# Patient Record
Sex: Female | Born: 1970 | Race: White | Hispanic: No | Marital: Married | Smoking: Never smoker
Health system: Southern US, Community
[De-identification: ages and names within clinical notes are randomized; demographics above are authoritative.]

## PROBLEM LIST (undated history)

## (undated) DIAGNOSIS — Z973 Presence of spectacles and contact lenses: Secondary | ICD-10-CM

## (undated) DIAGNOSIS — M953 Acquired deformity of neck: Secondary | ICD-10-CM

## (undated) DIAGNOSIS — I10 Essential (primary) hypertension: Secondary | ICD-10-CM

## (undated) DIAGNOSIS — E119 Type 2 diabetes mellitus without complications: Secondary | ICD-10-CM

## (undated) DIAGNOSIS — Z9289 Personal history of other medical treatment: Secondary | ICD-10-CM

## (undated) DIAGNOSIS — E669 Obesity, unspecified: Secondary | ICD-10-CM

## (undated) DIAGNOSIS — E785 Hyperlipidemia, unspecified: Secondary | ICD-10-CM

## (undated) DIAGNOSIS — Z8742 Personal history of other diseases of the female genital tract: Secondary | ICD-10-CM

## (undated) DIAGNOSIS — Z8249 Family history of ischemic heart disease and other diseases of the circulatory system: Secondary | ICD-10-CM

## (undated) HISTORY — DX: Hyperlipidemia, unspecified: E78.5

## (undated) HISTORY — DX: Personal history of other medical treatment: Z92.89

## (undated) HISTORY — DX: Presence of spectacles and contact lenses: Z97.3

## (undated) HISTORY — DX: Personal history of other diseases of the female genital tract: Z87.42

## (undated) HISTORY — DX: Obesity, unspecified: E66.9

## (undated) HISTORY — PX: BREAST LUMPECTOMY: SHX2

## (undated) HISTORY — DX: Acquired deformity of neck: M95.3

## (undated) HISTORY — DX: Family history of ischemic heart disease and other diseases of the circulatory system: Z82.49

## (undated) HISTORY — PX: TUBAL LIGATION: SHX77

## (undated) HISTORY — DX: Essential (primary) hypertension: I10

## (undated) HISTORY — DX: Type 2 diabetes mellitus without complications: E11.9

---
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
## 1998-01-31 DIAGNOSIS — I10 Essential (primary) hypertension: Secondary | ICD-10-CM

## 1998-01-31 HISTORY — DX: Essential (primary) hypertension: I10

## 2000-09-24 ENCOUNTER — Emergency Department (HOSPITAL_COMMUNITY): Admission: EM | Admit: 2000-09-24 | Discharge: 2000-09-24 | Payer: Self-pay | Admitting: Emergency Medicine

## 2000-09-24 ENCOUNTER — Encounter: Payer: Self-pay | Admitting: Emergency Medicine

## 2000-11-20 ENCOUNTER — Other Ambulatory Visit: Admission: RE | Admit: 2000-11-20 | Discharge: 2000-11-20 | Payer: Self-pay | Admitting: *Deleted

## 2001-03-21 ENCOUNTER — Ambulatory Visit (HOSPITAL_BASED_OUTPATIENT_CLINIC_OR_DEPARTMENT_OTHER): Admission: RE | Admit: 2001-03-21 | Discharge: 2001-03-21 | Payer: Self-pay | Admitting: *Deleted

## 2001-04-04 ENCOUNTER — Ambulatory Visit (HOSPITAL_BASED_OUTPATIENT_CLINIC_OR_DEPARTMENT_OTHER): Admission: RE | Admit: 2001-04-04 | Discharge: 2001-04-04 | Payer: Self-pay | Admitting: *Deleted

## 2001-11-08 ENCOUNTER — Other Ambulatory Visit: Admission: RE | Admit: 2001-11-08 | Discharge: 2001-11-08 | Payer: Self-pay | Admitting: *Deleted

## 2002-01-31 DIAGNOSIS — Z8742 Personal history of other diseases of the female genital tract: Secondary | ICD-10-CM

## 2002-01-31 DIAGNOSIS — E119 Type 2 diabetes mellitus without complications: Secondary | ICD-10-CM

## 2002-01-31 HISTORY — DX: Personal history of other diseases of the female genital tract: Z87.42

## 2002-01-31 HISTORY — DX: Type 2 diabetes mellitus without complications: E11.9

## 2008-06-06 ENCOUNTER — Encounter (INDEPENDENT_AMBULATORY_CARE_PROVIDER_SITE_OTHER): Payer: Self-pay | Admitting: *Deleted

## 2008-06-06 DIAGNOSIS — I10 Essential (primary) hypertension: Secondary | ICD-10-CM | POA: Insufficient documentation

## 2008-06-26 ENCOUNTER — Ambulatory Visit: Payer: Self-pay | Admitting: Family Medicine

## 2008-06-26 DIAGNOSIS — N943 Premenstrual tension syndrome: Secondary | ICD-10-CM | POA: Insufficient documentation

## 2008-06-26 LAB — CONVERTED CEMR LAB
Alkaline Phosphatase: 103 units/L (ref 39–117)
BUN: 10 mg/dL (ref 6–23)
CO2: 24 meq/L (ref 19–32)
Cholesterol: 172 mg/dL (ref 0–200)
Creatinine, Ser: 0.73 mg/dL (ref 0.40–1.20)
Glucose, Bld: 167 mg/dL — ABNORMAL HIGH (ref 70–99)
HCT: 40.5 % (ref 36.0–46.0)
HDL: 43 mg/dL (ref >39)
LDL Cholesterol: 97 mg/dL (ref 0–99)
MCHC: 32.3 g/dL (ref 30.0–36.0)
MCV: 82.7 fL (ref 78.0–100.0)
RBC: 4.9 M/uL (ref 3.87–5.11)
Sodium: 139 meq/L (ref 135–145)
Total Bilirubin: 0.5 mg/dL (ref 0.3–1.2)
Total CHOL/HDL Ratio: 4
Total Protein: 7.6 g/dL (ref 6.0–8.3)
Triglycerides: 160 mg/dL — ABNORMAL HIGH (ref <150)
VLDL: 32 mg/dL (ref 0–40)
WBC: 8.7 10*3/uL (ref 4.0–10.5)

## 2008-06-27 ENCOUNTER — Encounter: Payer: Self-pay | Admitting: Family Medicine

## 2009-01-08 ENCOUNTER — Ambulatory Visit: Payer: Self-pay | Admitting: Family Medicine

## 2009-01-08 DIAGNOSIS — E119 Type 2 diabetes mellitus without complications: Secondary | ICD-10-CM | POA: Insufficient documentation

## 2009-02-05 ENCOUNTER — Ambulatory Visit: Payer: Self-pay | Admitting: Family Medicine

## 2009-02-05 DIAGNOSIS — E669 Obesity, unspecified: Secondary | ICD-10-CM | POA: Insufficient documentation

## 2009-03-25 ENCOUNTER — Ambulatory Visit: Payer: Self-pay | Admitting: Family Medicine

## 2009-05-07 ENCOUNTER — Ambulatory Visit: Payer: Self-pay | Admitting: Family Medicine

## 2009-05-07 LAB — CONVERTED CEMR LAB
Alkaline Phosphatase: 76 units/L (ref 39–117)
BUN: 13 mg/dL (ref 6–23)
CO2: 23 meq/L (ref 19–32)
Cholesterol: 180 mg/dL (ref 0–200)
Creatinine, Ser: 0.6 mg/dL (ref 0.40–1.20)
Glucose, Bld: 112 mg/dL — ABNORMAL HIGH (ref 70–99)
HDL: 50 mg/dL (ref >39)
LDL Cholesterol: 107 mg/dL — ABNORMAL HIGH (ref 0–99)
Sodium: 137 meq/L (ref 135–145)
Total Bilirubin: 0.5 mg/dL (ref 0.3–1.2)
Total Protein: 7.6 g/dL (ref 6.0–8.3)
Triglycerides: 117 mg/dL (ref <150)
VLDL: 23 mg/dL (ref 0–40)

## 2009-05-08 ENCOUNTER — Encounter: Payer: Self-pay | Admitting: Family Medicine

## 2010-03-02 NOTE — Consult Note (Signed)
Summary: [facility]   Imported By: Clydell Hakim 05/14/2009 10:36:06  _____________________________________________________________________  External Attachment:    Type:   Image     Comment:   External Document  Appended Document: [facility]    Clinical Lists Changes  Observations: Added new observation of DIAB EYE EX: Dr Elmer Picker (05/01/2009 11:31)       Allergies: No Known Drug Allergies   -  Date:  05/01/2009    Diabetes Eye Exam Dr Elmer Picker

## 2010-03-02 NOTE — Assessment & Plan Note (Addendum)
Summary: f/u,df   Vital Signs:  Patient profile:   40 year old female Weight:      231.1 pounds Temp:     98.9 degrees F oral Pulse rate:   93 / minute Pulse rhythm:   regular BP sitting:   123 / 82  (left arm) Cuff size:   large  Vitals Entered By: Loralee Pacas CMA (May 07, 2009 9:51 AM)  History of Present Illness: HYPERTENSION Disease Monitoring Blood pressure range:not checking   Chest pain:N   Dyspnea:N Medications Compliance:daily   Lightheadedness:N   Edema:N   DIABETES Disease Monitoring Blood Sugar ranges:all below 200   Polyuria:N   Visual problems:N Medications Compliance:daily   Hypoglycemic symptoms:N   Prementral Syndrome stable with prilosec daily.  No bleeding or throat problems (symptoms after fish bone resolved)  PREVENTION Diet:starting   Exercise:starting  ROS - as above PMH - Medications reviewed and updated in medication list.  Smoking Status noted in VS form      Current Medications (verified): 1)  Centrum  Tabs (Multiple Vitamins-Minerals) .... Take 1 Tablet By Mouth Once Daily 2)  Calcium 600/vitamin D 600-400 Mg-Unit Tabs (Calcium Carbonate-Vitamin D) .... Take 2 Tablets By Mouth Once Daily 3)  Aspirin 81 Mg Tabs (Aspirin) .... Take 1 Tablet By Mouth Once Daily 4)  Super B Complex  Tabs (B Complex-C) .... Take 1 Tablet By Mouth Once Daily 5)  Zinc 50 Mg Tabs (Zinc) .... Take 1 Tablet By Mouth Two Times A Day 6)  Ester-C  Tabs (Bioflavonoid Products) .... Take 1 Tablet By Mouth Two Times A Day 7)  Prozac 20 Mg Caps (Fluoxetine Hcl) .Marland Kitchen.. 1 By Mouth Daily 8)  Lisinopril 20 Mg Tabs (Lisinopril) .Marland Kitchen.. 1 Tablet By Mouth Daily 9)  Metformin Hcl 1000 Mg Tabs (Metformin Hcl) .Marland Kitchen.. 1 By Mouth Two Times A Day 10)  Prilosec 10 Mg Cpdr (Omeprazole) .Marland Kitchen.. 1 At Bedtime May Increase To 2 At Bedtime Ifnot Working  Otc  Allergies: No Known Drug Allergies  Physical Exam  General:  Well-developed,well-nourished,in no acute distress; alert,appropriate  and cooperative throughout examination obese Lungs:  Normal respiratory effort, chest expands symmetrically. Lungs are clear to auscultation, no crackles or wheezes. Heart:  Normal rate and regular rhythm. S1 and S2 normal without gallop, murmur, click, rub or other extra sounds.  Diabetes Management Exam:    Foot Exam (with socks and/or shoes not present):       Sensory-Pinprick/Light touch:          Left medial foot (L-4): normal          Left dorsal foot (L-5): normal          Left lateral foot (S-1): normal          Right medial foot (L-4): normal          Right dorsal foot (L-5): normal          Right lateral foot (S-1): normal       Sensory-Monofilament:          Left foot: normal          Right foot: normal       Inspection:          Left foot: normal          Right foot: normal       Nails:          Left foot: normal          Right foot: normal  Impression & Recommendations:  Problem # 1:  HYPERTENSION (ICD-401.9)  Good control  Her updated medication list for this problem includes:    Lisinopril 20 Mg Tabs (Lisinopril) .Marland Kitchen... 1 tablet by mouth daily  Orders: Comp Met-[facility] [id number]) [facility]- Est  Level 4 ([address]214)  BP today: 123/82 Prior BP: 121/81 (03/25/2009)  Labs Reviewed: K+: 4.3 (06/26/2008) Creat: : 0.73 (06/26/2008)   Chol: 172 (06/26/2008)   HDL: 43 (06/26/2008)   LDL: 97 (06/26/2008)   TG: 160 (06/26/2008)  Problem # 2:  DIABETES MELLITUS (ICD-250.00)  good control.  She will be seeing Dr Elmer Picker for eye exam  Her updated medication list for this problem includes:    Aspirin 81 Mg Tabs (Aspirin) .Marland Kitchen... Take 1 tablet by mouth once daily    Lisinopril 20 Mg Tabs (Lisinopril) .Marland Kitchen... 1 tablet by mouth daily    Metformin Hcl 1000 Mg Tabs (Metformin hcl) .Marland Kitchen... 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   Will monitor  Her updated medication list for this problem includes:    Aspirin 81 Mg Tabs (Aspirin) .Marland Kitchen... Take 1 tablet by mouth once daily  Orders: [facility]- Est  Level 4 (57846)  Complete Medication List: 1)  Centrum Tabs (Multiple vitamins-minerals) .... Take 1 tablet by mouth once daily 2)  Calcium 600/vitamin D 600-400 Mg-unit Tabs (Calcium carbonate-vitamin d) .... Take 2 tablets by mouth once daily 3)  Aspirin 81 Mg Tabs (Aspirin) .... Take 1 tablet by mouth once daily 4)  Super B Complex Tabs (B complex-c) .... Take 1 tablet by mouth once daily 5)  Zinc 50 Mg Tabs (Zinc) .... Take 1 tablet by mouth two times a day 6)  Ester-c Tabs (Bioflavonoid products) .... Take 1 tablet by mouth two times a day 7)  Prozac 20 Mg Caps (Fluoxetine hcl) .Marland Kitchen.. 1 by mouth daily 8)  Lisinopril 20 Mg Tabs (Lisinopril) .Marland Kitchen.. 1 tablet by mouth daily 9)  Metformin Hcl 1000 Mg Tabs (Metformin hcl) .Marland Kitchen.. 1 by mouth two times a day 10)  Prilosec 10 Mg Cpdr (Omeprazole) .Marland Kitchen.. 1 at bedtime may increase to 2 at bedtime ifnot working  otc  Other Orders: Tdap => 58yrs IM (96295) Admin 1st Vaccine (28413)  Patient Instructions: 1)  Come back for Pap Test 2)  I will call you if your lab is abnormal otherwise I will send you a letter within 2 weeks. 3)  Ask Dr Elmer Picker to send me a report on your eye exam 4)  Work on exercise and diet Prescriptions: LISINOPRIL 20 MG TABS (LISINOPRIL) 1 tablet by mouth daily  #90 x 3   Entered and Authorized by:   Pearlean Brownie MD   Signed by:   Pearlean Brownie MD on 05/07/2009   Method used:   Electronically to        [facility] (retail)       [address]       [place], [place]  [address]       Ph: [phone] or [phone]       Fax:  [phone]   RxID:  [id number]   Laboratory Results   Blood Tests   Date/Time Received: May 07, 2009 9:50 AM  Date/Time Reported: May 07, 2009 10:11 AM   HGBA1C: 6.9%   (Normal Range: Non-Diabetic - 3-6%   Control Diabetic - 6-8%)  Comments: ...........test performed by...........Marland KitchenTerese Door, CMA       Prevention & Chronic Care Immunizations   Influenza vaccine: Not documented    Tetanus booster: 05/07/2009: Tdap    Pneumococcal vaccine: Not documented  Other Screening   Pap smear: Not documented   Smoking status: never  (03/25/2009)  Diabetes Mellitus   HgbA1C: 6.9  (05/07/2009)    Eye exam: Not documented    Foot exam: yes  (05/07/2009)   High risk foot: Not documented   Foot care education: Not documented    Urine microalbumin/creatinine ratio: Not documented    Diabetes flowsheet reviewed?: Yes   Progress toward A1C goal: At goal  Lipids   Total Cholesterol: 172  (06/26/2008)   LDL: 97  (06/26/2008)   LDL Direct: Not documented   HDL: 43  (06/26/2008)   Triglycerides: 160  (06/26/2008)  Hypertension   Last Blood Pressure: 123 / 82  (05/07/2009)   Serum creatinine: 0.73  (06/26/2008)   Serum potassium 4.3  (06/26/2008) CMP ordered     Hypertension flowsheet reviewed?: Yes   Progress toward BP goal: At goal  Self-Management Support :    Diabetes self-management support: Not documented    Hypertension self-management support: Not documented   Immunizations Administered:  Tetanus Vaccine:    Vaccine Type: Tdap    Site: left deltoid    Mfr: [employer]    Dose: 0.5 ml    Route: IM    Given by: Jone Baseman CMA    Exp. Date: 04/24/2011    Lot #: [id number]    VIS given: 12/19/06 version given May 07, 2009.

## 2010-03-02 NOTE — Assessment & Plan Note (Signed)
Summary: fish bone stuck in throat,tcb   Vital Signs:  Patient profile:   40 year old female Height:      64 inches Weight:      230 pounds BMI:     39.62 Temp:     98.4 degrees F oral Pulse rate:   109 / minute BP sitting:   121 / 81  (left arm) Cuff size:   large  Vitals Entered By: Tessie Fass CMA (March 25, 2009 11:48 AM) CC: bone stuck in throat? Is Patient Diabetic? Yes Pain Assessment Patient in pain? yes     Location: head Intensity: 4   CC:  bone stuck in throat?Marland Kitchen  History of Present Illness: Patient presents with acute complaint of pain with degluttition.  Was at [facility] for lunch on Sunday, Feb 20th, believes she swallowed a bone and had pain in her throat with swallowing.  No cough, no choking, did not feel like she aspirated.  Did not vomit.  Since then has persisted with pain only upon initiating swallowing. Has been able to eat despite the pain.  Has not developed cough or rhinorrhea, no diarrhea or vomiting.  Does not have hoarseness as she did when diagnosed wtih GERD.  Still taking prilosec with good relief of GERD.  Of note, her mother in law died this morning.  Has been hospitalized for awhile, family had been visiting her with gowns and masks in hospital.  Has had a lot of stress related to this, and to the care of her 40 yr old special-needs son and her 15 yr old daughter.  Declines anxiolytic medications for short-term stress related to this situation.   Habits & Providers  Alcohol-Tobacco-Diet     Tobacco Status: never  Current Medications (verified): 1)  Centrum  Tabs (Multiple Vitamins-Minerals) .... Take 1 Tablet By Mouth Once Daily 2)  Calcium 600/vitamin D 600-400 Mg-Unit Tabs (Calcium Carbonate-Vitamin D) .... Take 2 Tablets By Mouth Once Daily 3)  Aspirin 81 Mg Tabs (Aspirin) .... Take 1 Tablet By Mouth Once Daily 4)  Super B Complex  Tabs (B Complex-C) .... Take 1 Tablet By Mouth Once Daily 5)  Zinc 50 Mg Tabs (Zinc) ....  Take 1 Tablet By Mouth Two Times A Day 6)  Ester-C  Tabs (Bioflavonoid Products) .... Take 1 Tablet By Mouth Two Times A Day 7)  Prozac 20 Mg Caps (Fluoxetine Hcl) .Marland Kitchen.. 1 By Mouth Daily 8)  Lisinopril 20 Mg Tabs (Lisinopril) .Marland Kitchen.. 1 Tablet By Mouth Daily 9)  Metformin Hcl 1000 Mg Tabs (Metformin Hcl) .Marland Kitchen.. 1 By Mouth Two Times A Day 10)  Prilosec 10 Mg Cpdr (Omeprazole) .Marland Kitchen.. 1 At Bedtime May Increase To 2 At Bedtime Ifnot Working  Otc 11)  Lidocaine Viscous 2 % Soln (Lidocaine Hcl) .... Sig Gargle With Viscous Lidocaine Every Hour As Needed Disp 4 Oz or Other Standard Size  Allergies (verified): No Known Drug Allergies  Physical Exam  General:  well appearing, no apparent distress. talking normally.  Eyes:  Clear sclerae and conjunctivae Ears:  External ear exam shows no significant lesions or deformities.  Otoscopic examination reveals clear canals, tympanic membranes are intact bilaterally without bulging, retraction, inflammation or discharge. Hearing is grossly normal bilaterally. Nose:  External nasal examination shows no deformity or inflammation. Nasal mucosa are pink and moist without lesions or exudates. Mouth:  Oral mucosa and oropharynx without lesions or exudates.  Teeth in good repair. Mallampati 3 (difficult to visualize entire oropharynx and uvula) Neck:  No deformities, masses, or tenderness noted. Lungs:  Normal respiratory effort, chest expands symmetrically. Lungs are clear to auscultation, no crackles or wheezes.   Impression & Recommendations:  Problem # 1:  ACUTE PHARYNGITIS (ICD-462) Patient with dysphagia since swallowing a fish bone at lunch on Feb 20th; has persisted wtih pain only with swallowing, otherwise able to eat/drink normally.  Never affected her airway.  Will try viscous lidocaine gargles as needed for presumed irritation/minor trauma of oropharynx.  If not better, consider endoscopic evaluation by ENT or GI for question or foreign body retention (although  seems exceedingly unlikely). Mother in law died this morning, I suspect that her other complaints of achiness, etc are related to poor sleep, stress associated with family situation.  No symptoms of ILI, or Gastroenteritis. Her updated medication list for this problem includes:    Aspirin 81 Mg Tabs (Aspirin) .Marland Kitchen... Take 1 tablet by mouth once daily    Lidocaine Viscous 2 % Soln (Lidocaine hcl) ..... Sig gargle with viscous lidocaine every hour as needed disp 4 oz or other standard size  Complete Medication List: 1)  Centrum Tabs (Multiple vitamins-minerals) .... Take 1 tablet by mouth once daily 2)  Calcium 600/vitamin D 600-400 Mg-unit Tabs (Calcium carbonate-vitamin d) .... Take 2 tablets by mouth once daily 3)  Aspirin 81 Mg Tabs (Aspirin) .... Take 1 tablet by mouth once daily 4)  Super B Complex Tabs (B complex-c) .... Take 1 tablet by mouth once daily 5)  Zinc 50 Mg Tabs (Zinc) .... Take 1 tablet by mouth two times a day 6)  Ester-c Tabs (Bioflavonoid products) .... Take 1 tablet by mouth two times a day 7)  Prozac 20 Mg Caps (Fluoxetine hcl) .Marland Kitchen.. 1 by mouth daily 8)  Lisinopril 20 Mg Tabs (Lisinopril) .Marland Kitchen.. 1 tablet by mouth daily 9)  Metformin Hcl 1000 Mg Tabs (Metformin hcl) .Marland Kitchen.. 1 by mouth two times a day 10)  Prilosec 10 Mg Cpdr (Omeprazole) .Marland Kitchen.. 1 at bedtime may increase to 2 at bedtime ifnot working  otc 11)  Lidocaine Viscous 2 % Soln (Lidocaine hcl) .... Sig gargle with viscous lidocaine every hour as needed disp 4 oz or other standard size Prescriptions: LIDOCAINE VISCOUS 2 % SOLN (LIDOCAINE HCL) SIG Gargle with viscous lidocaine every hour as needed DISP 4 oz or other standard size  #1 x 3   Entered and Authorized by:   Paula Compton MD   Signed by:   Paula Compton MD on 03/25/2009   Method used:   Electronically to        [facility] (retail)       [address]       [place], [place]  [address]       Ph: [phone] or [phone]        Fax: [phone]   RxID:   [id number]   Appended Document: fish bone stuck in throat,tcb    Clinical Lists Changes  Orders: Added new Test order of [facility]- Est Level  3 ([address]) - Signed

## 2010-03-02 NOTE — Assessment & Plan Note (Signed)
Summary: FU/KH   Vital Signs:  Patient profile:   40 year old female Height:      64 inches Weight:      230.2 pounds BMI:     39.66 Temp:     98.7 degrees F oral Pulse rate:   97 / minute BP sitting:   131 / 83  (right arm) Cuff size:   large  Vitals Entered By: Garen Grams LPN (February 05, 2009 11:04 AM) CC: f/u dm and BP Is Patient Diabetic? Yes Did you bring your meter with you today? Yes Pain Assessment Patient in pain? no        CC:  f/u dm and BP.  History of Present Illness: COUGH Onset: had several weeks ago during last visit presumed mild URI Description: tickle in throat worse at PM sometimes will wake up with cough Modifying factors: None   Symptoms Productive: N Wheezing: N Dyspnea: N Nasal discharge: mild but not post nasal drip  Fever: N Sore throat: not sore but does have occaisional episode when wil throw up and be hoarse for a few days Sick contacts: N Heartburn symptoms: uses zantac as needed but not regularly  History of Asthma: N  Red Flags  Weight loss: N Hemoptysis: N Edema: N  HYPERTENSION Disease Monitoring   Blood pressure range: not taking      Chest pain: N     Dyspnea:N Medications   Compliance: daily   Lightheadedness: N     Edema:N Prevention   Exercise: N    Salt restriction:N  ROS - as above PMH - Medications reviewed and updated in medication list.  Smoking Status noted in VS form        Habits & Providers  Alcohol-Tobacco-Diet     Tobacco Status: never  Current Medications (verified): 1)  Centrum  Tabs (Multiple Vitamins-Minerals) .... Take 1 Tablet By Mouth Once Daily 2)  Calcium 600/vitamin D 600-400 Mg-Unit Tabs (Calcium Carbonate-Vitamin D) .... Take 2 Tablets By Mouth Once Daily 3)  Aspirin 81 Mg Tabs (Aspirin) .... Take 1 Tablet By Mouth Once Daily 4)  Super B Complex  Tabs (B Complex-C) .... Take 1 Tablet By Mouth Once Daily 5)  Zinc 50 Mg Tabs (Zinc) .... Take 1 Tablet By Mouth Two Times A Day 6)   Ester-C  Tabs (Bioflavonoid Products) .... Take 1 Tablet By Mouth Two Times A Day 7)  Prozac 20 Mg Caps (Fluoxetine Hcl) .Marland Kitchen.. 1 By Mouth Daily 8)  Lisinopril 20 Mg Tabs (Lisinopril) .Marland Kitchen.. 1 Tablet By Mouth Daily 9)  Metformin Hcl 1000 Mg Tabs (Metformin Hcl) .Marland Kitchen.. 1 By Mouth Two Times A Day 10)  Prilosec 10 Mg Cpdr (Omeprazole) .Marland Kitchen.. 1 At Bedtime May Increase To 2 At Bedtime Ifnot Working  Otc  Allergies: No Known Drug Allergies  Physical Exam  General:  Well-developed,well-nourished,in no acute distress; alert,appropriate and cooperative throughout examination Nose:  External nasal examination shows no deformity or inflammation. Nasal mucosa are pink and moist without lesions or exudates. Mouth:  Oral mucosa and oropharynx without lesions or exudates.  Teeth in good repair. Neck:  No deformities, masses, or tenderness noted. Lungs:  Normal respiratory effort, chest expands symmetrically. Lungs are clear to auscultation, no crackles or wheezes. Heart:  Normal rate and regular rhythm. S1 and S2 normal without gallop, murmur, click, rub or other extra sounds.   Impression & Recommendations:  Problem # 1:  COUGH (ICD-786.2)  most consistent with GE reflux.  No signs of infection  or bronchospasm.  Start regular over the counter ppi for cost reasons and follow  Orders: [facility]- Est Level  3 ([address])  Problem # 2:  HYPERTENSION (ICD-401.9) controlled today  Her updated medication list for this problem includes:    Lisinopril 20 Mg Tabs (Lisinopril) .Marland Kitchen... 1 tablet by mouth daily  BP today: 131/83 Prior BP: 147/85 (01/08/2009)  Labs Reviewed: K+: 4.3 (06/26/2008) Creat: : 0.73 (06/26/2008)   Chol: 172 (06/26/2008)   HDL: 43 (06/26/2008)   LDL: 97 (06/26/2008)   TG: 160 (06/26/2008)  Problem # 3:  DIABETES MELLITUS (ICD-250.00) some of her fasting readings today were > 200 but her A1c s are good will recheck A1c in 3 months  Her updated medication list for this problem includes:     Aspirin 81 Mg Tabs (Aspirin) .Marland Kitchen... Take 1 tablet by mouth once daily    Lisinopril 20 Mg Tabs (Lisinopril) .Marland Kitchen... 1 tablet by mouth daily    Metformin Hcl 1000 Mg Tabs (Metformin hcl) .Marland Kitchen... 1 by mouth two times a day  Problem # 4:  OBESITY (ICD-278.00) gained weight over the holidays.  She realizes she needs to work on this  Complete Medication List: 1)  Centrum Tabs (Multiple vitamins-minerals) .... Take 1 tablet by mouth once daily 2)  Calcium 600/vitamin D 600-400 Mg-unit Tabs (Calcium carbonate-vitamin d) .... Take 2 tablets by mouth once daily 3)  Aspirin 81 Mg Tabs (Aspirin) .... Take 1 tablet by mouth once daily 4)  Super B Complex Tabs (B complex-c) .... Take 1 tablet by mouth once daily 5)  Zinc 50 Mg Tabs (Zinc) .... Take 1 tablet by mouth two times a day 6)  Ester-c Tabs (Bioflavonoid products) .... Take 1 tablet by mouth two times a day 7)  Prozac 20 Mg Caps (Fluoxetine hcl) .Marland Kitchen.. 1 by mouth daily 8)  Lisinopril 20 Mg Tabs (Lisinopril) .Marland Kitchen.. 1 tablet by mouth daily 9)  Metformin Hcl 1000 Mg Tabs (Metformin hcl) .Marland Kitchen.. 1 by mouth two times a day 10)  Prilosec 10 Mg Cpdr (Omeprazole) .Marland Kitchen.. 1 at bedtime may increase to 2 at bedtime ifnot working  otc  Patient Instructions: 1)  Please schedule a follow-up appointment in 3 months .  2)  Call if blood sugar are regularly > 200 or if blood pressure is regularly > 140/90 3)  Use omeprazole for cough - take daily if no better in 3 -4 weeks call 4)  You need to have a Pap Smear to prevent cervical cancer.   Prevention & Chronic Care Immunizations   Influenza vaccine: Not documented    Tetanus booster: Not documented    Pneumococcal vaccine: Not documented  Other Screening   Pap smear: Not documented   Smoking status: never  (02/05/2009)  Diabetes Mellitus   HgbA1C: 7.3  (01/08/2009)    Eye exam: Not documented    Foot exam: Not documented   High risk foot: Not documented   Foot care education: Not documented    Urine  microalbumin/creatinine ratio: Not documented  Lipids   Total Cholesterol: 172  (06/26/2008)   LDL: 97  (06/26/2008)   LDL Direct: Not documented   HDL: 43  (06/26/2008)   Triglycerides: 160  (06/26/2008)  Hypertension   Last Blood Pressure: 131 / 83  (02/05/2009)   Serum creatinine: 0.73  (06/26/2008)   Serum potassium 4.3  (06/26/2008)  Self-Management Support :    Diabetes self-management support: Not documented    Hypertension self-management support: Not documented

## 2010-03-02 NOTE — Letter (Signed)
Summary: Generic Letter  [facility]  [address]   [place], [place] [address]   Phone: [phone]  Fax: [phone]    05/08/2009  Elaine Brown [address] [place], [place]  [address]  Dear Ms. Guardado,  Your blood tests were all good except for your cholesterol.  The LDL (bad) cholesterol went up to 107. It should be below 100.   We can start you on a medicine or you can work on diet and exercise and see if you can bring it down.   We should recheck a fasting cholesterol in 6 months if you want to try diet.  Call and let me know if you would like to start a medicine  Sincerely,   Pearlean Brownie MD  Appended Document: Generic Letter mailed.

## 2010-05-21 ENCOUNTER — Encounter: Payer: Self-pay | Admitting: Family Medicine

## 2010-05-21 ENCOUNTER — Ambulatory Visit (INDEPENDENT_AMBULATORY_CARE_PROVIDER_SITE_OTHER): Payer: Self-pay | Admitting: Family Medicine

## 2010-05-21 ENCOUNTER — Ambulatory Visit: Payer: Self-pay | Admitting: Family Medicine

## 2010-05-21 VITALS — BP 137/94 | HR 112 | Temp 99.7°F | Ht 67.0 in | Wt 231.0 lb

## 2010-05-21 DIAGNOSIS — N762 Acute vulvitis: Secondary | ICD-10-CM

## 2010-05-21 DIAGNOSIS — N760 Acute vaginitis: Secondary | ICD-10-CM

## 2010-05-21 DIAGNOSIS — E119 Type 2 diabetes mellitus without complications: Secondary | ICD-10-CM

## 2010-05-21 LAB — POCT WET PREP (WET MOUNT): Trichomonas Wet Prep HPF POC: NEGATIVE

## 2010-05-21 MED ORDER — CIPROFLOXACIN HCL 500 MG PO TABS: 500.0000 mg | ORAL_TABLET | Freq: Two times a day (BID) | ORAL | Status: AC

## 2010-05-21 MED ORDER — SULFAMETHOXAZOLE-TRIMETHOPRIM 800-160 MG PO TABS: ORAL_TABLET | ORAL | Status: DC

## 2010-05-21 MED ORDER — CIPROFLOXACIN HCL 750 MG PO TABS: 750.0000 mg | ORAL_TABLET | Freq: Two times a day (BID) | ORAL | Status: DC

## 2010-05-21 MED ORDER — FLUCONAZOLE 150 MG PO TABS: ORAL_TABLET | ORAL | Status: DC

## 2010-05-21 NOTE — Patient Instructions (Signed)
You must come in to see Dr. Deirdre Priest ASAP next week, clinic on Monday to follow up on this infection  Cipro and Bactrim for bacterial infection Fluconazole for yeast

## 2010-05-21 NOTE — Progress Notes (Signed)
  Subjective:    Patient ID: Elaine Brown, female    DOB: 09-17-1970, 40 y.o.   MRN: [id number]  HPI Patient reports a painful swollen vaginal area for several weeks, increasing in intensity for the past few days.  She endorses not taking her metformin for quite sometime and not coming in for regular visits as she knows that she should.  Financially she is strapped.  She spends her time caring for a special needs son.  She denies chills and fever.  She did have one episode this week of lightheadedness but it resolved.  She denies nausea and vomiting.    Review of Systems  Constitutional: Negative for fever and chills.  Gastrointestinal: Negative for nausea and vomiting.  Genitourinary: Positive for vaginal pain. Negative for dysuria, difficulty urinating and pelvic pain.  Neurological: Positive for light-headedness.       Objective:   Physical Exam  Constitutional:       Red faced, alert and cooperative  Cardiovascular: Regular rhythm.        tachycardia  Pulmonary/Chest: Effort normal and breath sounds normal.  Genitourinary:       Red, indurated labia, right > left, thick white discharge with thin vaginal walls.  Right labia likely 10 times its normal size.    Musculoskeletal: She exhibits edema.  Skin: Skin is dry.  Psychiatric: Her behavior is normal.          Assessment & Plan:

## 2010-05-21 NOTE — Assessment & Plan Note (Signed)
Has not taken metformin for months

## 2010-05-21 NOTE — Assessment & Plan Note (Signed)
Very concerning for severe infection and potential for systemic complications.  She is tachy and febrile (low grade).  Consulted with preceptor and will treat with Bactrim DS, 2 tabs bid for 10 days, Cipro 500 mg bid for 7 days, fluconazole 100 mg on 3 occasions, today, on day 3 and when antibiotics are completed.  She is to see her primary MD on Monday for follow up.  Reinforced that poor glycemic control will give rise to these infections and that she needs to care for herself.  Did not do extra labs as she is very concerned about bills and it would not have changed the plan.

## 2010-05-26 ENCOUNTER — Ambulatory Visit (INDEPENDENT_AMBULATORY_CARE_PROVIDER_SITE_OTHER): Payer: Self-pay | Admitting: Family Medicine

## 2010-05-26 ENCOUNTER — Encounter: Payer: Self-pay | Admitting: Family Medicine

## 2010-05-26 VITALS — BP 131/82 | HR 120 | Temp 98.6°F | Ht 64.0 in | Wt 223.5 lb

## 2010-05-26 DIAGNOSIS — N762 Acute vulvitis: Secondary | ICD-10-CM

## 2010-05-26 DIAGNOSIS — N760 Acute vaginitis: Secondary | ICD-10-CM

## 2010-05-26 DIAGNOSIS — E119 Type 2 diabetes mellitus without complications: Secondary | ICD-10-CM

## 2010-05-26 DIAGNOSIS — I10 Essential (primary) hypertension: Secondary | ICD-10-CM

## 2010-05-26 LAB — COMPREHENSIVE METABOLIC PANEL
ALT: 64 U/L — ABNORMAL HIGH (ref 0–35)
Albumin: 4.5 g/dL (ref 3.5–5.2)
CO2: 20 mEq/L (ref 19–32)
Calcium: 9.4 mg/dL (ref 8.4–10.5)
Chloride: 96 mEq/L (ref 96–112)
Glucose, Bld: 262 mg/dL — ABNORMAL HIGH (ref 70–99)
Potassium: 5.1 mEq/L (ref 3.5–5.3)
Sodium: 129 mEq/L — ABNORMAL LOW (ref 135–145)
Total Protein: 8.2 g/dL (ref 6.0–8.3)

## 2010-05-26 LAB — POCT GLYCOSYLATED HEMOGLOBIN (HGB A1C): Hemoglobin A1C: 8.8

## 2010-05-26 MED ORDER — METFORMIN HCL 1000 MG PO TABS: 1000.0000 mg | ORAL_TABLET | Freq: Two times a day (BID) | ORAL | Status: DC

## 2010-05-26 NOTE — Assessment & Plan Note (Signed)
Well controlled.  No signs of complications or red flags.  Continue current regimen.  Check labs

## 2010-05-26 NOTE — Progress Notes (Signed)
  Subjective:    Patient ID: Elaine Brown, female    DOB: [birth year], 40 y.o.   MRN: [id number]  HPI  Vaginal infection - improving.  Swelling decreased.  Taking antibiotic without problems.  No fever  DIABETES Disease Monitoring: Blood Sugar ranges-not checking Polyuria/phagia/dipsia- no      Visual problems- no Medications: Compliance- out of metformin for months Hypoglycemic symptoms- no  HYPERTENSION Disease Monitoring Blood pressure range-not checking Chest pain- no      Dyspnea- no Medications Compliance- taking lisinopril daily Lightheadedness- no   Edema- no  Review of Symptoms - see HPI  PMH - Smoking status noted.  No problems with taking metformin in the past     Review of Systems     Objective:   Physical Exam    Heart - Regular rate and rhythm.  No murmurs, gallops or rubs.    Lungs:  Normal respiratory effort, chest expands symmetrically. Lungs are clear to auscultation, no crackles or wheezes. Vagina - left labia approximately 2x size of Right. No fluctuance minimally tender diffusely red     Assessment & Plan:

## 2010-05-26 NOTE — Assessment & Plan Note (Signed)
Improving.  Continue medications.  Discussed poor diabetes mellitus control likely contributor

## 2010-05-26 NOTE — Patient Instructions (Signed)
Come back in 3 month for a Pap smear and Diabetes check  Call if the infection is not steadily improving  I will call you if your tests are not good.  Otherwise I will send you a letter.  If you do not hear from me with in 2 weeks please call our office.     Take metformin twice a day every day

## 2010-05-26 NOTE — Assessment & Plan Note (Signed)
Poorly controlled due to lack of medications.  Restart and recheck in 3  Months

## 2010-05-27 ENCOUNTER — Encounter: Payer: Self-pay | Admitting: Family Medicine

## 2010-06-18 NOTE — Op Note (Signed)
[place]. [facility]  Patient:    Elaine Brown, Elaine Brown Visit Number: [id number] MRN: [id number]          Service Type: DSU Location: [facility] Attending Physician:  Kendell Bane Dictated by:   Lowell Bouton, M.D. Proc. Date: 03/21/01 Admit Date:  03/21/2001 Discharge Date: 03/21/2001   CC:         Bernadene Person, M.D.   Operative Report  PREOPERATIVE DIAGNOSIS:  Right carpal tunnel syndrome.  POSTOPERATIVE DIAGNOSIS:  Right carpal tunnel syndrome.  PROCEDURE:  Decompression median nerve right carpal tunnel.  SURGEON:  Lowell Bouton, M.D.  ANESTHESIA:  0.5% Marcaine local with sedation.  OPERATIVE FINDINGS:  The patient had significant pressure on the median nerve. The carpal canal was very tight and there were no masses identified.  PROCEDURE:  Under 0.5% Marcaine local anesthesia with a tourniquet on the right arm, the right hand was prepped and draped in usual fashion.  After exsanguinating the limb, the tourniquet was inflated to 250 mmHg. A longitudinal incision was made in the palm that measured about 3 cm in length just ulnar to the thenar crease.  Sharp dissection was carried through the subcutaneous tissues and bleeding points were coagulated.  Blunt dissection was carried distal to the transverse carpal ligament through the superficial palmar fascia.  A hemostat was placed in the carpal canal up against the hook of the Hamate and the transverse carpal ligament was divided on the ulnar border of the median nerve.  The proximal end of the ligament was divided with the scissors after dissecting the nerve away from the undersurface of the ligament.  The carpal canal was then palpated and was found to be adequately decompressed.  The nerve was examined and an external epineurolysis was performed.  The motor branch was identified and found to be intact.  The wound was then irrigated and skin was closed with a 4-0  nylon suture.  Sterile dressings were applied, followed by a volar wrist splint.  The tourniquet was released with good circulation to the hand.  The patient went to the recovery room awake and stable in good condition. Dictated by:   Lowell Bouton, M.D. Attending Physician:  Kendell Bane DD:  03/21/01 TD:  03/22/01 Job: [id number]

## 2010-06-18 NOTE — Op Note (Signed)
[place]. [facility]  Patient:    Elaine Brown, Elaine Brown Visit Number: [id number] MRN: [id number]          Service Type: DSU Location: [facility] Attending Physician:  Kendell Bane Dictated by:   Lowell Bouton, M.D. Proc. Date: 04/04/01 Admit Date:  04/04/2001   CC:         Bernadene Person, M.D.   Operative Report  PREOPERATIVE DIAGNOSIS:  Left carpal tunnel syndrome.  POSTOPERATIVE DIAGNOSIS:  Left carpal tunnel syndrome.  OPERATION PERFORMED:  Decompression median nerve, left carpal tunnel.  SURGEON:  Lowell Bouton, M.D.  ANESTHESIA:  0.5% Marcaine local with sedation.  OPERATIVE FINDINGS:  The patient had a very narrow carpal canal with no masses present.  The nerve had some significant compression.  DESCRIPTION OF PROCEDURE:  Under 0.5% Marcaine local anesthesia with a tourniquet on the left arm, the left hand was prepped and draped in the usual fashion and after exsanguinating the limb, the tourniquet was inflated to 250 mmHg.  A 3 cm longitudinal incision was made in the palm just ulnar to the thenar crease.  Sharp dissection was carried through the subcutaneous tissues down to superficial palmar fascia.  Blunt dissection was carried through the superficial palmar fascia down to the transverse carpal ligament.  A hemostat was placed in the carpal canal up against the hook of the hamate and the transverse carpal ligament was divided on the ulnar border of the median nerve.  The proximal end of the ligament was divided with the scissors after dissecting the nerve away from the undersurface of the ligament.  The carpal canal was then palpated and was found to be adequately decompressed.  The nerve was released from the undersurface of the ligament.  The wound was irrigated with saline and the skin was closed with 4-0 nylon sutures. Sterile dressings were applied followed by a volar wrist splint.  The patient tolerated the  procedure well and went to the recovery room awake and stable in good condition. Dictated by:   Lowell Bouton, M.D. Attending Physician:  Kendell Bane DD:  04/04/01 TD:  04/04/01 Job: [id number]

## 2010-07-26 ENCOUNTER — Encounter: Payer: Self-pay | Admitting: Family Medicine

## 2010-07-26 ENCOUNTER — Ambulatory Visit (INDEPENDENT_AMBULATORY_CARE_PROVIDER_SITE_OTHER): Payer: Self-pay | Admitting: Family Medicine

## 2010-07-26 VITALS — BP 146/87 | HR 118 | Temp 98.7°F | Ht 64.25 in | Wt 226.0 lb

## 2010-07-26 DIAGNOSIS — Z124 Encounter for screening for malignant neoplasm of cervix: Secondary | ICD-10-CM

## 2010-07-26 DIAGNOSIS — E119 Type 2 diabetes mellitus without complications: Secondary | ICD-10-CM

## 2010-07-26 DIAGNOSIS — I10 Essential (primary) hypertension: Secondary | ICD-10-CM

## 2010-07-26 LAB — COMPREHENSIVE METABOLIC PANEL
AST: 20 U/L (ref 0–37)
Albumin: 4.5 g/dL (ref 3.5–5.2)
Alkaline Phosphatase: 74 U/L (ref 39–117)
Potassium: 4.2 mEq/L (ref 3.5–5.3)
Sodium: 138 mEq/L (ref 135–145)
Total Bilirubin: 0.5 mg/dL (ref 0.3–1.2)
Total Protein: 7.6 g/dL (ref 6.0–8.3)

## 2010-07-26 LAB — LIPID PANEL
LDL Cholesterol: 106 mg/dL — ABNORMAL HIGH (ref 0–99)
VLDL: 26 mg/dL (ref 0–40)

## 2010-07-26 MED ORDER — LISINOPRIL 20 MG PO TABS: 20.0000 mg | ORAL_TABLET | Freq: Every day | ORAL | Status: DC

## 2010-07-26 NOTE — Patient Instructions (Addendum)
I will call you if your lab tests are not normal.  Otherwise we will discuss them at your next visit.  Check your blood pressure at least once a week and write down and bring in your readings next visit.  If > 150/95 call us  Consider meeting with our Health Coach to work on your weight next visit  Come back in 1 month to check your diabetes

## 2010-07-26 NOTE — Assessment & Plan Note (Signed)
Not well controlled but may be due to anxiety about pelvic.  Monitor at home

## 2010-07-26 NOTE — Progress Notes (Signed)
  Subjective:    Patient ID: Elaine Brown, female    DOB: 1970/12/20, 40 y.o.   MRN: [id number]  HPI  Gyn exam - infection completely resolved.  No discharge.  Regular menstrual periods   HYPERTENSION Disease Monitoring Blood pressure range-not checking Chest pain- no      Dyspnea- no Medications Compliance- daily lisinopril Lightheadedness- no   Edema- no  Review of Symptoms - see HPI  PMH - Smoking status noted.      Review of Systems     Objective:   Physical Exam    Genitalia:  Normal introitus for age,mildly irritated rash over pattern of panties, no vaginal discharge, mucosa pink and moist, no vaginal or cervical lesions, no vaginal atrophy, no friaility or hemorrhage, exam limited by habitus but normal uterus size and position, no adnexal masses or tenderness     Assessment & Plan:

## 2010-09-01 ENCOUNTER — Ambulatory Visit (INDEPENDENT_AMBULATORY_CARE_PROVIDER_SITE_OTHER): Payer: Self-pay | Admitting: Family Medicine

## 2010-09-01 ENCOUNTER — Encounter: Payer: Self-pay | Admitting: Family Medicine

## 2010-09-01 VITALS — BP 147/93 | HR 97 | Temp 99.4°F | Ht 64.0 in | Wt 229.8 lb

## 2010-09-01 DIAGNOSIS — I10 Essential (primary) hypertension: Secondary | ICD-10-CM

## 2010-09-01 DIAGNOSIS — E119 Type 2 diabetes mellitus without complications: Secondary | ICD-10-CM

## 2010-09-01 LAB — POCT GLYCOSYLATED HEMOGLOBIN (HGB A1C): Hemoglobin A1C: 7.5

## 2010-09-01 NOTE — Assessment & Plan Note (Signed)
Lab Results  Component Value Date   NA 138 07/26/2010   K 4.2 07/26/2010   CL 99 07/26/2010   CO2 24 07/26/2010   BUN 13 07/26/2010   CREATININE 0.74 07/26/2010   CREATININE 0.60 05/07/2009    BP Readings from Last 3 Encounters:  09/01/10 147/93  07/26/10 146/87  05/26/10 131/82    Assessment: Hypertension control:  moderately elevated  Progress toward goals:  deteriorated Barriers to meeting goals:  nonadherence to medications  Plan: Hypertension treatment:  continue current medications

## 2010-09-01 NOTE — Assessment & Plan Note (Signed)
Lab Results  Component Value Date   HGBA1C 7.5 09/01/2010   HGBA1C 6.9 05/07/2009   CREATININE 0.74 07/26/2010   CREATININE 0.60 05/07/2009   CHOL 178 07/26/2010   HDL 46 07/26/2010   TRIG 132 07/26/2010    Last eye exam and foot exam: No results found for this basename: HMDIABEYEEXA, HMDIABFOOTEX    Assessment: Diabetes control: controlled Progress toward goals: improved Barriers to meeting goals: nonadherence to medications  Plan: Diabetes treatment: continue current medications Refer to: none Instruction/counseling given: discussed the need for weight loss and discussed diet

## 2010-09-01 NOTE — Progress Notes (Signed)
  Subjective:    Patient ID: Elaine Brown, female    DOB: 1970/02/18, 40 y.o.   MRN: [id number]  HPI  HYPERTENSION Disease Monitoring Home BP Monitoring not doing Chest pain- no     Dyspnea-  no  Medications Compliance: missedher meds the last 2 days just forgot. Lightheadedness-  no  Edema-  no   ROS - See HPI  PMH Cardiovascular risk factors: sedentary lifestyle Lab Review   Potassium  Date Value Range Status  07/26/2010 4.2  3.5-5.3 (mEq/L) Final     Sodium  Date Value Range Status  07/26/2010 138  135-145 (mEq/L) Final     DIABETES Disease Monitoring: Blood Sugar ranges-not checking Polyuria/phagia/dipsia- no      Visual problems- no  Medications: Compliance- forgot meds last 2 days Hypoglycemic symptoms- no  Review of Symptoms - see HPI  PMH - Smoking status noted.    Review of Systems     Objective:   Physical Exam   Heart - Regular rate and rhythm.  No murmurs, gallops or rubs.    Lungs:  Normal respiratory effort, chest expands symmetrically. Lungs are clear to auscultation, no crackles or wheezes. Extremities:  No cyanosis, edema, or deformity noted with good range of motion of all major joints.        Assessment & Plan:

## 2010-09-01 NOTE — Patient Instructions (Signed)
Your A1c is 7.5 which is better but would be best below 7  Your blood pressure too high - work to remember to take meds every day Call if your blood pressure if regularly > 150/90 goal is < 140/90  Your most important health issue your weight - diet and exercise is the key  Come back in  6 weeks to see me and our Health Coach

## 2010-09-16 NOTE — Progress Notes (Signed)
Scheduled appt for 9/20 10 am while you are precepting.

## 2010-10-21 ENCOUNTER — Ambulatory Visit (INDEPENDENT_AMBULATORY_CARE_PROVIDER_SITE_OTHER): Payer: Self-pay | Admitting: Family Medicine

## 2010-10-21 ENCOUNTER — Encounter: Payer: Self-pay | Admitting: Family Medicine

## 2010-10-21 DIAGNOSIS — I10 Essential (primary) hypertension: Secondary | ICD-10-CM

## 2010-10-21 DIAGNOSIS — E669 Obesity, unspecified: Secondary | ICD-10-CM

## 2010-10-21 NOTE — Assessment & Plan Note (Addendum)
Assessment: Hypertension Control: slight improvement Progress toward goals: pt is conscientious of where her blood pressure should be; pt reports taking medications daily. Barriers to meeting goals: sedentary lifestyle, diet   Plan: Hypertension treatment: continue current medications, daily bp monitoring, continued counseling with health educator.   If elevated BP persists will increase ACEI or add diuretic

## 2010-10-21 NOTE — Progress Notes (Signed)
  Subjective:    Patient ID: Elaine Brown, female    DOB: 10/23/1970, 40 y.o.   MRN: [id number]  HPI  HYPERTENSION Disease Monitoring  Blood pressure range-Pt has not been monitoring at home  Chest pain- no  Dyspnea- no Medications Compliance- pt reports taking medicine daily with very few missed days. Lightheadedness- no Edema- slight in feet  OBESITY Current weight/BMI :  39.20 How long have been obese:  10 + years Course:  maintenance Problems or symptoms it causes:  Hypertension, diabetes   Things have tried to improve:  Cutting back on portions  Pt declined flu vaccine  Patient Identified Concern: weight loss, bp and diabetes control. Stage of Change Patient Is In: contemplation, pt is ready to make behavioral changes within the next 6 months.  Patient Reported Barriers: time/schedule conflicts, no motivation, tired, physically sore. Patient Reported Perceived Benefits: Better health, more energy for others  Patient Reports Self-Efficacy:  Unsure, pt has knowledge but lacks motivation and unsure if this is due to low self-efficacy.  Behavior Change Supports: to be discovered  Goals:  To wear pedometer daily and increase movement to 10,000 steps a day.  To continue to take medications daily and continue with health coaching.  Patient Education: We discussed importance of increasing daily movement, taking time for her self, lower stress, and chronic disease management.   Review of Symptoms - see HPI  PMH - Smoking status noted.     Review of Systems     Objective:   Physical Exam  No acute distress       Assessment & Plan:

## 2010-10-21 NOTE — Patient Instructions (Addendum)
1. Keep pedometer on tennis shoe and write down how many steps you take each day. 2. Take your blood pressure nightly and write it down. 3. Continue with daily medications. 4. Call Rosalita Chessman by 9/28 to follow up with steps. 5. Follow up visit with Dr. Deirdre Priest beginning of November.

## 2010-10-21 NOTE — Assessment & Plan Note (Signed)
Assessment: Weight Control: Pt lost 1 lbs since 8/12.  Pt making small strides to decreasing weight. Progress toward goals: slow but pt is ready to start working on weight. Barriers to meeting goals: sedentary lifestyle, excessive daily calories   Plan: Weight treatment: Start to increase movement by tracking daily steps with pedometer.  Goal is to build up to 10,000 steps a day.  Start to work on portion control and continue counseling with Theatre manager.

## 2010-10-29 ENCOUNTER — Telehealth: Payer: Self-pay | Admitting: Home Health Services

## 2010-10-29 NOTE — Telephone Encounter (Signed)
Spoke with [employer].  Pt reports keeping record of blood pressure.  Values have been hovering around 140/85.  Pt reports medication compliance.  Pt reports keeping record of daily steps and hits between 4,000-6,000 a day in her daily activities.  We dicussed how she would like to increase those numbers and she mentioned to start walking on treadmill.  I encouraged her to start that and to continue to keep a record.  Pt reports not feeling any different.  From this point we will continue weekly phone calls and start to work in increasing exercise and portion control.  Pt's overall goal is to feel better, less stress, weight loss, bp control.

## 2010-11-23 ENCOUNTER — Other Ambulatory Visit: Payer: Self-pay | Admitting: Family Medicine

## 2010-11-23 MED ORDER — FLUOXETINE HCL 20 MG PO CAPS: 20.0000 mg | ORAL_CAPSULE | Freq: Every day | ORAL | Status: DC

## 2010-11-25 ENCOUNTER — Telehealth: Payer: Self-pay | Admitting: Home Health Services

## 2010-11-25 NOTE — Telephone Encounter (Signed)
Spoke with [employer].  Pt reports taking medications daily.  Pt lost her pedometer and has not been keeping up with steps or exercises.  Pt has a hard time starting exercise plan.  Pt reports starting to change her diet and eating until she is satisfied versus full.  Pt would like to start eating 4-5 small meals through the day vs big meals.  Pt established goals to drink 4-5 bottles of water a day and will keep a tally as well as continuing diet modification and to buy a pedometer.  Pt reports less stress this past week.  Pt's overall goal is to lose weight, increase exercise and diabetes management.

## 2010-12-09 ENCOUNTER — Telehealth: Payer: Self-pay | Admitting: Home Health Services

## 2010-12-09 NOTE — Telephone Encounter (Signed)
Spoke with [employer].  Pt reports doing well.  She says things are going fine with her family. Taking medications daily and doesn't miss a day.   Pt has not been taking blood pressure or glucose. Pt reports not drinking as much water as she set goal for.  Instead she drink sweet tea and diet soda.  Pt reports not exercising or keeping up with daily steps.  Pt is stuggling with motivation and get's loss in her daily chores and family responsibilities. We talked about pairing new habits with ones she is already doing to lasting change.  Pt has recommitted to increasing her daily water.    Pts overall goals is to lose weight/increase exercise/manage diabetes/HTN

## 2010-12-21 ENCOUNTER — Telehealth: Payer: Self-pay | Admitting: Home Health Services

## 2010-12-21 NOTE — Telephone Encounter (Signed)
Spoke with [employer].  Pt reports taking medications daily without missing a day. Pt says she feels stressed right now with holiday and copes with it by avoiding it. We discussed other ways to manage stress including prayer, deep breathing, mindfulness.  Pt reports getting renewed with [employer] [place] orange card.  We talked about her coming in end of December for A1c follow up.  Pt reports drink 2-3 Lg cups of water day. (my guess is > 32 oz. Cup)  This next week pt would like to continue increasing water and getting more movement into her daily life.  Pt's over all goal is weight loss/dm management.

## 2011-01-07 ENCOUNTER — Telehealth: Payer: Self-pay | Admitting: Home Health Services

## 2011-01-07 NOTE — Telephone Encounter (Signed)
Spoke with [employer].  Pt reports having good week, feeling good.  Pt reports missing medications 2x last week and sometimes forgets to take second dose of metformin in the evenings. Pt not self-monitoring glucose.  We dicussed taking time for her self and becoming conscientious about her body and health.  Pt agreed and set goal to taking fasting glucose a few times next week and keeping record to report back to me.  Pt is not exercising but making plans with spouse to start using treadmill in January.  Pt set goal to continue to increase daily water vs. unsweetened ice tea/diet sodas.  Pt also would like to star taking time out for herself and giving herself enough care.  Pt's overall goal is weight loss/dm management.

## 2011-01-14 ENCOUNTER — Telehealth: Payer: Self-pay | Admitting: Home Health Services

## 2011-01-14 NOTE — Telephone Encounter (Signed)
Spoke with [employer].  Pt reports doing well and having good week.  Pt reports taking medications as prescribed without missing day.  Pt took fasting glucose last 3 days. 12/12 276 12/13 253 12/14 241  We talked about how self-monitoring brings more awareness to healthy choices throughout the day.  There was a 30 point drop in blood sugar over past 3 days.  Elaine Brown set goal to continue daily monitoring to improve number.  Elaine Brown reports low stress this past week even though she had some stressful situations.  Pt's overall goal is DM management, weight loss, self-care.

## 2011-02-03 ENCOUNTER — Telehealth: Payer: Self-pay | Admitting: Home Health Services

## 2011-02-03 NOTE — Telephone Encounter (Signed)
Spoke with [employer].  Pt reports taking medications daily without missing a day.  Pt has not been self monitoring DM.    Pt with family has recently started creating monthly menu plans and reducing portion sizes.  Family now eat out less and eats 3-4 small meals throughout the day.  Pt reports increasing water intake and does not drink a lot of soda.  Pt reports clothes feeling looser.  Pt's goal for this week is to monitor fasting blood sugar daily.   Pt has follow up appointment next week and will bring in blood sugar values.  Pt's overall goal is DM management, weight loss, self-care.

## 2011-02-09 ENCOUNTER — Ambulatory Visit (INDEPENDENT_AMBULATORY_CARE_PROVIDER_SITE_OTHER): Payer: Self-pay | Admitting: Family Medicine

## 2011-02-09 ENCOUNTER — Encounter: Payer: Self-pay | Admitting: Family Medicine

## 2011-02-09 VITALS — BP 127/79 | HR 94 | Temp 98.3°F | Ht 64.0 in | Wt 234.5 lb

## 2011-02-09 DIAGNOSIS — E119 Type 2 diabetes mellitus without complications: Secondary | ICD-10-CM

## 2011-02-09 DIAGNOSIS — E669 Obesity, unspecified: Secondary | ICD-10-CM

## 2011-02-09 NOTE — Patient Instructions (Addendum)
1. Continue to monitor fasting blood sugar daily and keep record. 2. Continue to increase daily water intake. 3. Start using treadmill/bike on Monday and Friday during the price is right. 4. Consider increasing fruits/vegetables and whole grains for constipation.  5. Schedule an eye exam.  6. Follow up exam with Dr. Deirdre Priest in 3 months for A1c and Lipid blood work- come in fasting.

## 2011-02-09 NOTE — Progress Notes (Signed)
  Subjective:    Patient ID: Elaine Brown, female    DOB: 07/24/1970, 41 y.o.   MRN: [id number]  HPI  DIABETES Disease Monitoring: Blood Sugar ranges-150-200 Polyuria/phagia/dipsia- no      Visual problems- no  Medications: Compliance- Pt reports taking medications as prescribed Hypoglycemic symptoms- not reported  HYPERTENSION Disease Monitoring  Blood pressure range-pt not self monitoring  Chest pain- no  Dyspnea- no Medications Compliance- pt reports taking medications as prescibed Lightheadedness- no  Edema- pt reports some in feet   OBESITY Current weight/BMI : Body mass index is 40.25 kg/(m^2).    How long have been obese:  10+years Course:  worsening Problems or symptoms it causes:  DM/HTN   Things have tried to improve:  Some diet changes  Patient Identified Concern:  Weight, dm management Stage of Change Patient Is In:  Contemplation- planning on making changes in next 6 months Patient Reported Barriers:  Laziness, poor habits, busy with family and church. Patient Reported Perceived Benefits:  Better health Patient Reports Self-Efficacy:   Pt display little self-efficacy for lasting changes, pt has problems motivating herself Behavior Change Supports:  Family has recently started making diet changes eating small meals throughout the day vs big one and eating out less.  Goals:  Exercise 2x a week on treadmill/bike, continue to limit sodas/teas and increase daily water intake Patient Education:  We discussed importance of exercise for weight loss and strategies to start habits of exercising.    Review of Systems     Objective:   Physical Exam No acute distress       Assessment & Plan:

## 2011-02-09 NOTE — Assessment & Plan Note (Signed)
Assessment: Worsening 5lb wt gain since last appointment.  Pt is concerned about weight and has started making diet changes with family. Progress toward goals: pt has take a back slide as far as weight, but has made other changes like drinking more water  Barriers to meeting goals: sedentary lifestyle, eating fast food  Plan: Continued counseling with health educator.  Goal of exercising 2x a week on treadmill/bike.  Smaller portions. Continued weekly phone calls for accountability.

## 2011-02-09 NOTE — Assessment & Plan Note (Signed)
Worsening control.  Discussed increasing her metformin but she would like to try weight loss and exercise.  Will recheck a1c in 3 months.  She will be in frequent contact with our health coach

## 2011-02-17 ENCOUNTER — Telehealth: Payer: Self-pay | Admitting: Home Health Services

## 2011-02-17 NOTE — Telephone Encounter (Signed)
Loralee called and left message.  Pt reports walking in treadmill 5-15 minutes for the past week.  Pt reports increase and drinking mainly water.  Pt has been tracking calories with computer and has stayed below daily calories of 1820 for the past few days.  Pt feels good and feels like she is making progress.  Follow up with pt with a phone call.  Pt reports taking medications daily, not missing any days.  Fast bld glucose yesterday 142, today 186.  Pt reports not eating past 7   Pt's overall goal is weight loss/ dm management.

## 2011-03-03 ENCOUNTER — Telehealth: Payer: Self-pay | Admitting: Home Health Services

## 2011-03-03 NOTE — Telephone Encounter (Signed)
Spoke with [employer].  Pt reports taking medications most days, missed 2 days last week. Pt reports fasting blood glucose to be around 150 or a little higher.  Pt reports walking on treadmill/exercise bike 3-4 times a week and has been logging her daily steps between 4,000-6,000 a day.  Pt is keeping a record with myfitnesspal application.  We talked about possible reasons why her blood sugar stays above 150 and she couldn't identify any reasons.  We talked about keeping a food journal to see if that was a reasons.  Pt did not commit to keeping a journal.  Pt's goal this week is to continue to exercise 3-4 times a week, continue to limit sweet tea and focus on drinking just water.  Pt reports losing 2 lbs this past 2 weeks.   Pt's overall goal is wt loss, dm management.

## 2011-03-10 ENCOUNTER — Telehealth: Payer: Self-pay | Admitting: Home Health Services

## 2011-03-10 NOTE — Telephone Encounter (Signed)
Spoke with [employer].  Pt reports taking medications daily without missing any days. Fasting blood sugar today was 152.  Pt reports walking on treadmill and using bike 3x this past week.  Pt reports wt loss of 1 lbs this past week. Pt reports keeping count of step with pedometer and had reached 7100 steps this past week.  Pt is also keeping track of diet on myfitnesspal application.  Reports it helps her stay mindful of what she is eating and drinking.  Pt's goal this week is to exercise 3x.  Pt's overall goal is wt loss, dm management.

## 2011-03-28 ENCOUNTER — Telehealth: Payer: Self-pay | Admitting: Home Health Services

## 2011-03-28 NOTE — Telephone Encounter (Signed)
Spoke with [employer].  Pt reports feeling okay but has not been keeping up with diet/exercise for past two weeks.  Pt reports fasting blood sugar values between 150-185.  Pt self reports weight at 231.6 lbs.  We discussed what a better blood sugar value was 120 or below. We also talked about tracking food to montior calories, carbs, sugar, protein.  Pt had questions about carbohydrates and blood sugar.  We talked about the importance of limiting them along with sugars to maintain healthy blood sugar values.  Pt set goal to track food this week, increase daily water consumption, and walk on treadmill 2-3x this week.  Pt's goal is wt loss, dm management.

## 2011-04-12 ENCOUNTER — Encounter: Payer: Self-pay | Admitting: Family Medicine

## 2011-04-12 ENCOUNTER — Ambulatory Visit (INDEPENDENT_AMBULATORY_CARE_PROVIDER_SITE_OTHER): Payer: Self-pay | Admitting: Family Medicine

## 2011-04-12 VITALS — BP 142/82 | HR 106 | Temp 99.2°F | Ht 64.0 in | Wt 229.0 lb

## 2011-04-12 DIAGNOSIS — J029 Acute pharyngitis, unspecified: Secondary | ICD-10-CM

## 2011-04-12 DIAGNOSIS — J020 Streptococcal pharyngitis: Secondary | ICD-10-CM

## 2011-04-12 LAB — POCT RAPID STREP A (OFFICE): Rapid Strep A Screen: POSITIVE — AB

## 2011-04-12 MED ORDER — PENICILLIN V POTASSIUM 500 MG PO TABS: 500.0000 mg | ORAL_TABLET | Freq: Three times a day (TID) | ORAL | Status: AC

## 2011-04-12 MED ORDER — LIDOCAINE VISCOUS 2 % MT SOLN: 20.0000 mL | OROMUCOSAL | Status: AC | PRN

## 2011-04-12 NOTE — Progress Notes (Signed)
Subjective: The patient is a 41 y.o. year old female who presents today for sore throat.  Pt reports that 4 days ago began having throat pain after eating popcorn.  Felt like kernel was stuck.  Next morning awoke and had bad sore throat.  Has been gradually worsening.  Hurts to swallow.  Still feels like there is something stuck.  No fevers/chills, no cough, n/v/d.  Daughter has been feeling the same way for three days.  Objective:  Filed Vitals:   04/12/11 0959  BP: 142/82  Pulse: 106  Temp: 99.2 F (37.3 C)   Gen: NAD, obese HEENT: MMM, Throat mildly erythematous without exudates, no adenopathy  Rapid strep positive  Assessment/Plan: Strep pharyngitis.  It is possible that this is the initial presentation of something like a diverticulum or a throat abrasion.  However, with the positive rapid strep will treat with Abx and symptom management.  If does not get better in 2-3 days, instructed to RTC and will likely need further workup at that time.  Please also see individual problems in problem list for problem-specific plans.

## 2011-04-12 NOTE — Patient Instructions (Signed)
It was great to see you today! I am giving you penicillin to treat your strep throat. If you are not feeling better by Friday, please come back in to see [place]. We will probably need to do more testing at that time. I am also giving you viscous lidocaine to help with the throat pain. Try to avoid getting it in her mouth as this will cause your mouth to be numb as well.

## 2011-04-14 ENCOUNTER — Telehealth: Payer: Self-pay | Admitting: Home Health Services

## 2011-04-14 NOTE — Telephone Encounter (Signed)
Spoke with [employer].  Pt reports taking medications daily as prescribed without missing any days.  Pt self-monitoring blood glucose last week numbers were >200 yesterday 180, today 140.  Pt reports becoming more mindful of what her blood sugar is doing.  We talked about diet related to numbers.  We have documented 5 lb wt loss.  Pt is recovering from strep this week and hasn't been exercising.  Pt set goal to continue monitoring blood sugar this next week.  We will set further goals after she recovers from sickness.   Pt's overall goal is wt loss, dm management.

## 2011-04-22 ENCOUNTER — Telehealth: Payer: Self-pay | Admitting: Home Health Services

## 2011-04-22 NOTE — Telephone Encounter (Signed)
Spoke with [employer].  Pt reports taking medications daily without missing any day. Pt reported blood sugars typically less than 150 this past week, a few days around 128.   Pt reports eating less fried foods, only drinks water or unsweetened ice tea.   Both her and her husband are both working on weight loss. She finds being mindful about eating choices is very helpful and something she can maintain.  Schedule 3 month fu appointment for 4/10.  Pt 's goal this week is to continue with diet changes, daily blood glucose monitoring, and walking at least 20 minutes on the treadmill.  Pt's overall goal is wt loss, dm management, stress management.

## 2011-05-11 ENCOUNTER — Ambulatory Visit: Payer: Self-pay | Admitting: Family Medicine

## 2011-05-17 ENCOUNTER — Encounter: Payer: Self-pay | Admitting: Family Medicine

## 2011-05-18 ENCOUNTER — Telehealth: Payer: Self-pay | Admitting: Home Health Services

## 2011-05-18 NOTE — Telephone Encounter (Signed)
Spoke with [employer].  Pt reports taking medications daily without missing any days.  Reports at home blood sugar values 130-156.   Pt reports going to eye doctor yesterday and everything was fine.  Will fax [place] results, Dr. Nori Riis [facility] [address] [place] [address] [phone] phone number [phone] fax number  Pt reports still working on increasing water and working on portions.   Rescheduled office visit for next week.   Pt's overall goal is wt loss, dm management.

## 2011-05-25 ENCOUNTER — Encounter: Payer: Self-pay | Admitting: Family Medicine

## 2011-05-25 ENCOUNTER — Ambulatory Visit (INDEPENDENT_AMBULATORY_CARE_PROVIDER_SITE_OTHER): Payer: Self-pay | Admitting: Family Medicine

## 2011-05-25 VITALS — BP 147/81 | HR 97 | Temp 98.7°F | Ht 64.0 in | Wt 233.2 lb

## 2011-05-25 DIAGNOSIS — E119 Type 2 diabetes mellitus without complications: Secondary | ICD-10-CM

## 2011-05-25 DIAGNOSIS — M25569 Pain in unspecified knee: Secondary | ICD-10-CM

## 2011-05-25 DIAGNOSIS — E669 Obesity, unspecified: Secondary | ICD-10-CM

## 2011-05-25 NOTE — Patient Instructions (Signed)
1. Continue weekly health coaching phone calls.  Drink one large glass of water a night  Walk on treadmill 2-3 times a week for 15 minutes 2. Continue daily monitoring of blood sugar.  3.Take tylenol for knee pain, weight loss will also help with knee pain.   GREAT JOB!!  Keep up the good work!

## 2011-05-25 NOTE — Progress Notes (Addendum)
  Subjective:    Patient ID: Elaine Brown, female    DOB: 1970-12-18, 41 y.o.   MRN: [id number]  HPI  DIABETES Disease Monitoring: Blood Sugar ranges-140-180s Polyuria/phagia/dipsia- no      Visual problems- no  Medications: Compliance- taking regularly Hypoglycemic symptoms- no Elaine Brown,Elaine Brown   OBESITY Current weight/BMI : Body mass index is 40.03 kg/(m^2).    How long have been obese:  10+ yrs Course:  No improment Problems or symptoms it causes:  Dm, knee pain   Things have tried to improve:  Dietary changes  Patient Identified Concern:  Weight loss, knee pain, dm management Stage of Change Patient Is In:  Preparation- making changes less than 6 months. Patient Reported Barriers:  Habits, family routines,  Patient Reported Perceived Benefits:  Feel better, stop taking medications Patient Reports Self-Efficacy:   Pt displays some self-efficacy Behavior Change Supports:  Family is supportive of changes but doesn't make them happen.  Elaine Brown is responsible for making things happen Goals:  Drink 1 large glass of water every night, walk on treadmill 2-3x a week for 15 minutes.  Patient Education:  We talked about balancing meals with protein, some starch, and lot's of vegetables.  We talked about what exercise benefits.   KNEE PAIN Pain in both knees left > R.  Worse after sitting.  No locking or redness Review of Systems Elaine Brown,Elaine Brown     Objective:   Physical Exam  Left knee - good range of motion with mild crepitus.  No effusion       Assessment & Plan:

## 2011-05-25 NOTE — Assessment & Plan Note (Signed)
Assessment:  No improvement.  Plan:  Focus on increasing exercise, continued counseling with Health Coach. Set goal to walk on treadmill 2-3 times a week for 15 minutes and focus on just drinking water.

## 2011-05-26 DIAGNOSIS — M25569 Pain in unspecified knee: Secondary | ICD-10-CM | POA: Insufficient documentation

## 2011-05-26 NOTE — Assessment & Plan Note (Signed)
Consistent with degenerative joint disease.  Discussed course and treatments.  Emphasized weight control is a major contributor to worsening

## 2011-05-26 NOTE — Assessment & Plan Note (Signed)
Improved Continue medications and work on weight loss

## 2011-05-30 ENCOUNTER — Encounter: Payer: Self-pay | Admitting: Family Medicine

## 2011-05-30 ENCOUNTER — Ambulatory Visit (INDEPENDENT_AMBULATORY_CARE_PROVIDER_SITE_OTHER): Payer: Self-pay | Admitting: Family Medicine

## 2011-05-30 VITALS — BP 178/101 | HR 102 | Ht 64.0 in | Wt 229.0 lb

## 2011-05-30 DIAGNOSIS — M25569 Pain in unspecified knee: Secondary | ICD-10-CM

## 2011-05-30 DIAGNOSIS — M25561 Pain in right knee: Secondary | ICD-10-CM | POA: Insufficient documentation

## 2011-05-30 NOTE — Progress Notes (Signed)
Addended by: [place], Faylene Allerton on: 05/30/2011 03:51 PM   Modules accepted: Orders

## 2011-05-30 NOTE — Patient Instructions (Signed)
Thank you for coming in today. I will call you with the results.  Continue the naprosyn 2 pills twice a day.  The numbing medicine I injected into your knee will wear off after a few hours. The cortisone will start working after a day or so. The emergency room if you have red swollen knee that gets really painfull or starts to ooze pus.  Follow up with Dr. Salena Saner in 2 weeks or so.

## 2011-05-30 NOTE — Progress Notes (Signed)
Elaine Brown is a 40 y.o. female who presents to [facility] today for right knee pain starting Thursday. She was seen on Wednesday at the time practice [facility] for an unrelated issue. She started noting worsening right knee pain starting Thursday. She denies any trauma or injury. The pain started gradually and is associated with an effusion. She denies any fevers or chills. The pain has become worsening and more significant. She is taking Naprosyn and Tylenol without much help. She denies any personal history of gout but wonders if that's what this is.  She notes pain on the medial aspect of the anterior part of the knee area pain with range of motion and standing. No locking catching or clicking.   PMH, SH reviewed: Significant for obesity and hypertension ROS as above  Medications reviewed. Current Outpatient Prescriptions  Medication Sig Dispense Refill  . aspirin 81 MG tablet Take 81 mg by mouth daily.        . B Complex-C (SUPER B COMPLEX PO) Take 1 tablet by mouth daily.        Marland Kitchen Bioflavonoid Products (ESTER-C) TABS Take 1 tablet by mouth daily.        . Calcium Carbonate-Vitamin D (CALTRATE 600+D) 600-400 MG-UNIT per chew tablet Chew 2 tablets by mouth daily.        Marland Kitchen docusate sodium (COLACE) 100 MG capsule Take 100 mg by mouth 1 day or 1 dose.      Marland Kitchen FLUoxetine (PROZAC) 20 MG capsule Take 1 capsule (20 mg total) by mouth daily.  90 capsule  3  . lisinopril (PRINIVIL,ZESTRIL) 20 MG tablet Take 1 tablet (20 mg total) by mouth daily.  90 tablet  3  . metFORMIN (GLUCOPHAGE) 1000 MG tablet Take 1 tablet (1,000 mg total) by mouth 2 (two) times daily with a meal.  180 tablet  3  . Multiple Vitamins-Minerals (CENTRUM) tablet Take 1 tablet by mouth daily.        . Zinc 50 MG TABS Take 1 tablet by mouth daily.          Exam:  BP 178/101  Pulse 102  Ht 5\' 4"  (1.626 m)  Wt 229 lb (103.874 kg)  BMI 39.31 kg/m2  LMP 05/19/2011 Gen: Well NAD Lungs: CTABL Nl WOB Heart: RRR no MRG Abd: NABS, NT,  ND Exts: Non edematous BL  LE, warm and well perfused.  MSK: Right knee. No erythema however significant effusion. Mild pain with range of motion.  Negative McMurray's Lachman's and normal valgus and varus stress.    Procedure note:  Consent obtained and timeout performed. The suprapatellar lateral space was cleaned with Betadine. A 25-gauge needle was used to inject 1% lidocaine without epinephrine along the planned aspiration route achieving good anesthesia.  The knee was then cleaned again with Betadine. After a few minutes a 18-gauge needle was used to access the superior lateral patellar space.  20 mL of translucent light yellow fluid was removed.  The syringe was changed and 40 mg Kenalog and 4 mL of lidocaine was injected into the knee.  A bandage was then applied.  Following the procedure the patient had diminished pain.  No results found for this or any previous visit (from the past 72 hour(s)).

## 2011-05-30 NOTE — Assessment & Plan Note (Addendum)
Acute monoarthritis without signs or symptoms consistent with infection or trauma. Suspect gout ot osteo-arthritis.  Will obtain fluid sample for crystal analysis.  Will call patient with results and plan.  Steroids injected today will likely help.

## 2011-06-03 ENCOUNTER — Telehealth: Payer: Self-pay | Admitting: Home Health Services

## 2011-06-03 NOTE — Telephone Encounter (Signed)
Spoke with [employer].  Pt reports taking medications daily without missing any days.  Pt also takes blood sugar daily.  Numbers have been in the range of 150-170.  Pt reports knee is feeling better but feels it sometimes when she walks.  Pt reports not being able to walk on treadmill this past week.  Pt reports meeting goal of drinking more water daily.  Pt set goal this next week to continue to focus on water and to make better food choices.  She will walk if she feels she can with knee pain.  Pt's overall goal is wt loss, dm management.

## 2011-06-09 ENCOUNTER — Other Ambulatory Visit: Payer: Self-pay | Admitting: Family Medicine

## 2011-06-09 DIAGNOSIS — E119 Type 2 diabetes mellitus without complications: Secondary | ICD-10-CM

## 2011-06-09 MED ORDER — METFORMIN HCL 1000 MG PO TABS: 1000.0000 mg | ORAL_TABLET | Freq: Two times a day (BID) | ORAL | Status: DC

## 2011-06-29 ENCOUNTER — Encounter: Payer: Self-pay | Admitting: Family Medicine

## 2011-06-29 ENCOUNTER — Ambulatory Visit (INDEPENDENT_AMBULATORY_CARE_PROVIDER_SITE_OTHER): Payer: Self-pay | Admitting: Family Medicine

## 2011-06-29 VITALS — BP 152/90 | HR 94 | Temp 98.6°F | Ht 64.0 in | Wt 230.0 lb

## 2011-06-29 DIAGNOSIS — E785 Hyperlipidemia, unspecified: Secondary | ICD-10-CM

## 2011-06-29 DIAGNOSIS — M25569 Pain in unspecified knee: Secondary | ICD-10-CM

## 2011-06-29 DIAGNOSIS — I10 Essential (primary) hypertension: Secondary | ICD-10-CM

## 2011-06-29 DIAGNOSIS — E119 Type 2 diabetes mellitus without complications: Secondary | ICD-10-CM

## 2011-06-29 DIAGNOSIS — M25561 Pain in right knee: Secondary | ICD-10-CM

## 2011-06-29 MED ORDER — TRAMADOL HCL 50 MG PO TABS: 50.0000 mg | ORAL_TABLET | Freq: Three times a day (TID) | ORAL | Status: AC | PRN

## 2011-06-29 MED ORDER — LISINOPRIL-HYDROCHLOROTHIAZIDE 20-12.5 MG PO TABS: 1.0000 | ORAL_TABLET | Freq: Every day | ORAL | Status: DC

## 2011-06-29 MED ORDER — METFORMIN HCL 1000 MG PO TABS: 1000.0000 mg | ORAL_TABLET | Freq: Two times a day (BID) | ORAL | Status: DC

## 2011-06-29 NOTE — Assessment & Plan Note (Signed)
No at goal will recheck FLP and add statin as necessary

## 2011-06-29 NOTE — Assessment & Plan Note (Signed)
Not well controlled.  Add low dose diuretic to ARB

## 2011-06-29 NOTE — Progress Notes (Signed)
  Subjective:    Patient ID: Elaine Brown, female    DOB: Nov 17, 1970, 41 y.o.   MRN: [id number]  HPI  R Knee Improved somewhat from injection.  Still with pain with prolnged walking or standing.  No soft tissue swelling or redness or fever or other joints  Using ibuprofen or husbands ultram  HYPERTENSION Disease Monitoring Home BP Monitoring not doing Chest pain- no     Dyspnea-  no  Medications Compliance: taking as prescribed. Lightheadedness-  no  Edema-  no   ROS - See HPI  PMH Lab Review   Potassium  Date Value Range Status  07/26/2010 4.2  3.5-5.3 (mEq/L) Final     Sodium  Date Value Range Status  07/26/2010 138  135-145 (mEq/L) Final       Review of Symptoms - see HPI  PMH - Smoking status noted.       Review of Systems     Objective:   Physical Exam Heart - Regular rate and rhythm.  No murmurs, gallops or rubs.    R Knee - FROM without effusion or laxity Mildly tender diffusely        Assessment & Plan:

## 2011-06-29 NOTE — Patient Instructions (Signed)
Come in fasting after you have taken the new hctz/lisinopril for 1-2 weeks.   I will send you a letter or call with the results  Call the day before  Use tylenol or ultram as needed for knee pain

## 2011-06-29 NOTE — Assessment & Plan Note (Signed)
Most consistent with degenerative joint disease.  Discussed possible causes and therapies.  Will go with weight control and analgesics but may consider xray in future

## 2011-07-08 ENCOUNTER — Telehealth: Payer: Self-pay | Admitting: Home Health Services

## 2011-07-08 NOTE — Telephone Encounter (Signed)
Spoke with [employer].  Pt reports taking medications daily without missing any days. Pt reports fasting blood sugar ranges 120-150,  Pt reports feeling okay and is low stress right now.  Pt reports not including any exercise this past week.  Pt reports increase daily water consumption.  She reports drinking 2-3 large cups of water a day.   Pt will be out of town next week and the goal is to make healthier food choices while on vacation.  Avoiding fried foods and sweets.  Pt's over all goal is weight loss, dm management.

## 2011-07-29 ENCOUNTER — Telehealth: Payer: Self-pay | Admitting: Home Health Services

## 2011-07-29 NOTE — Telephone Encounter (Signed)
Spoke with [employer].  Pt reports taking medications as prescribed, missing 1 day this past week.  Pt reports fasting blood sugar values between 130-155.   Pt has recently made diet modifications which include reducing carb's in diet.  Pt reports having done this for the past week.  Pt reports trying to walk more with daughter but has not started any regular exercise routine.   Pt reports knee pain has lessened and believes it is due to her dietary changes.   Pt set goal to continue with dietary changes and to talk at least 3x for 10 minutes this next week.  Pt's overall goal is weight loss, dm management.

## 2011-08-02 ENCOUNTER — Other Ambulatory Visit: Payer: Self-pay

## 2011-08-02 DIAGNOSIS — E785 Hyperlipidemia, unspecified: Secondary | ICD-10-CM

## 2011-08-02 LAB — LIPID PANEL
Cholesterol: 195 mg/dL (ref 0–200)
HDL: 42 mg/dL (ref >39)
Triglycerides: 141 mg/dL (ref <150)

## 2011-08-02 LAB — COMPREHENSIVE METABOLIC PANEL
BUN: 14 mg/dL (ref 6–23)
CO2: 25 mEq/L (ref 19–32)
Calcium: 9.6 mg/dL (ref 8.4–10.5)
Chloride: 98 mEq/L (ref 96–112)
Creat: 0.62 mg/dL (ref 0.50–1.10)
Glucose, Bld: 165 mg/dL — ABNORMAL HIGH (ref 70–99)
Total Bilirubin: 0.6 mg/dL (ref 0.3–1.2)

## 2011-08-02 NOTE — Progress Notes (Signed)
CMP AND FLP DONE TODAY Everlyn Farabaugh 

## 2011-08-05 ENCOUNTER — Telehealth: Payer: Self-pay | Admitting: Family Medicine

## 2011-08-05 ENCOUNTER — Encounter: Payer: Self-pay | Admitting: Family Medicine

## 2011-08-05 NOTE — Telephone Encounter (Signed)
Left voicemail to call us about her cholesterol and need to start medication

## 2011-08-18 ENCOUNTER — Telehealth: Payer: Self-pay | Admitting: Home Health Services

## 2011-08-18 NOTE — Telephone Encounter (Signed)
Spoke with [employer].  Pt reports taking medications as prescribed without missing any days.  Fasting blood sugar ranges 130-150.    Pt reports maintaining reduced carbohydrates in diet.  Reports less knee pain and has maintained daily water consumption.   Pt reports not walking/exercising very much.  Pt set goal to continue diet changes and to decrease stress this next week by prayer, avoidance and deep breathing.  Pt's overall goal is weight loss, dm management

## 2011-08-19 ENCOUNTER — Ambulatory Visit (INDEPENDENT_AMBULATORY_CARE_PROVIDER_SITE_OTHER): Payer: Self-pay | Admitting: Family Medicine

## 2011-08-19 ENCOUNTER — Encounter: Payer: Self-pay | Admitting: Family Medicine

## 2011-08-19 VITALS — BP 120/83 | HR 106 | Temp 98.5°F | Ht 64.0 in | Wt 229.0 lb

## 2011-08-19 DIAGNOSIS — N008 Acute nephritic syndrome with other morphologic changes: Secondary | ICD-10-CM

## 2011-08-19 DIAGNOSIS — R05 Cough: Secondary | ICD-10-CM

## 2011-08-19 DIAGNOSIS — M25561 Pain in right knee: Secondary | ICD-10-CM

## 2011-08-19 DIAGNOSIS — R059 Cough, unspecified: Secondary | ICD-10-CM

## 2011-08-19 DIAGNOSIS — M25569 Pain in unspecified knee: Secondary | ICD-10-CM

## 2011-08-19 MED ORDER — RANITIDINE HCL 150 MG PO TABS: 150.0000 mg | ORAL_TABLET | Freq: Two times a day (BID) | ORAL | Status: DC

## 2011-08-19 NOTE — Assessment & Plan Note (Signed)
A: acute cough differentials include URI/post viral cough, allergic rhinitis with postnasal drip, GERD, bronchospasm, lung malignancy, and ACE-inhibitor side effect.  Normal exam and history most suggestive of GERD.  P: per AVS -Zantac BID -small meals -wait 1 hr between eating and lying down. -f/u prn per AVS: if cough is not better consider allergic component and treat with anti-histamine.

## 2011-08-19 NOTE — Progress Notes (Signed)
Subjective:     Patient ID: Elaine Brown, female   DOB: 1970-10-07, 41 y.o.   MRN: [id number]  HPI 41 yo F with past medical history significant for obesity and GERD with past pregnancies presents with non-productive cough x 5 days. Her cough was initially associated by a sore throat at nigh time but this resolved after day 2. She reports that he cough is worse at night. Last night she had a small amount of reflux. She denies sick contacts, fever, chills, weight loss,  nausea, vomiting and shortness of breath.   Med history: taking prinzide has tolerated lisinopril for many years w/o side effects.   Review of Systems As per HPI     Objective:   Physical Exam BP 120/83  Pulse 106  Temp 98.5 F (36.9 C) (Oral)  Ht 5\' 4"  (1.626 m)  Wt 229 lb (103.874 kg)  BMI 39.31 kg/m2  SpO2 96% General appearance: alert, cooperative and no distress Eyes: conjunctivae/corneas clear. PERRL, EOM's intact.  Nose: Nares normal. Septum midline. Mucosa normal. No drainage or sinus tenderness. Throat: lips, mucosa, and tongue normal; teeth and gums normal Neck: no adenopathy, no carotid bruit, no JVD, supple, symmetrical, trachea midline and thyroid not enlarged, symmetric, no tenderness/mass/nodules Lungs: clear to auscultation bilaterally Heart: regular rate and rhythm, S1, S2 normal, no murmur, click, rub or gallop Abdomen: obese,  soft, non-tender; bowel sounds normal; no masses,  no organomegaly  Assessment and Plan:

## 2011-08-19 NOTE — Patient Instructions (Addendum)
Elaine Brown,  It was very nice to see you again.  Please say hello to Anette Riedel and Mr. Dill for me.   For you cough I suspect GERD: 1. Start zantac twice daily  2. Small meals, wait at least 1 hr between eating and lying down .   If you cough persist despite zantac x 1 week please come back for re-evaluation.  Also if you sore throat worsens, you develop fever, develop shortness of breath or cough becomes productive please come back.   Dr. Armen Pickup

## 2011-09-09 ENCOUNTER — Telehealth: Payer: Self-pay | Admitting: Home Health Services

## 2011-09-09 NOTE — Telephone Encounter (Signed)
Spoke with [employer].  Pt is taking medications and is not currently monitoring bs at home. Out of testing strips.  Pt reports not having worked on any goals for past 2 weeks other than exercising a few times at the beach.   We talked about time to re-evaluate pts health priorities and goals and to call me back next week when she has quite moment to re-focus health coaching outcomes.  She was in car today with family and was not an appropriate time to re-evaluate.  Pt agreed.  Will call back next week.   Pt's initial overall goals were weight loss, dm management.

## 2011-09-16 ENCOUNTER — Telehealth: Payer: Self-pay | Admitting: Home Health Services

## 2011-09-16 NOTE — Telephone Encounter (Signed)
Spoke with [employer].  Pt reports taking medications. Has not been monitoring bp at home for some time because she is out of strips and can not afford more for another month.    Pt reports pain in her knee which prevents her from exercising.   Pt reports not diet changes and hasn't been working towards any goals for 2 + weeks.  We talked about taking a break from health coaching for a few weeks and pt will call back when she is ready to start again.

## 2011-11-17 ENCOUNTER — Telehealth: Payer: Self-pay | Admitting: Home Health Services

## 2011-11-17 NOTE — Telephone Encounter (Signed)
Spoke with [employer].  Pt reports taking medications as prescribed.  Has not been at home monitoring bs, has not had strips.   Pt reports continuing to try and cut back on starches and sugary drinks.  Pt reports no regular exercise.   Last A1c 4/13.   Schedule fu appointment for DM for Monday 10/21 before her orange card expires on 10/26.  Scheduled with health coach during PCP precepting hours.

## 2011-11-21 ENCOUNTER — Encounter: Payer: Self-pay | Admitting: Family Medicine

## 2011-11-21 ENCOUNTER — Ambulatory Visit (INDEPENDENT_AMBULATORY_CARE_PROVIDER_SITE_OTHER): Payer: Self-pay | Admitting: Family Medicine

## 2011-11-21 VITALS — BP 107/72 | HR 96 | Temp 98.5°F | Ht 64.0 in | Wt 224.0 lb

## 2011-11-21 DIAGNOSIS — E119 Type 2 diabetes mellitus without complications: Secondary | ICD-10-CM

## 2011-11-21 DIAGNOSIS — E669 Obesity, unspecified: Secondary | ICD-10-CM

## 2011-11-21 DIAGNOSIS — I10 Essential (primary) hypertension: Secondary | ICD-10-CM

## 2011-11-21 LAB — POCT GLYCOSYLATED HEMOGLOBIN (HGB A1C): Hemoglobin A1C: 6.9

## 2011-11-21 NOTE — Assessment & Plan Note (Signed)
Doing well.  Continue current medications.  If remains controlled may decrease metformin next visit

## 2011-11-21 NOTE — Patient Instructions (Addendum)
Self-Care goals/plans: Continue to take medications as prescribed Continue to keep track of blood glucose and keep track of blood pressure Eat health foods: drink diet soda or water instead of juice, eat baked foods instead of fried foods, try low-fat dairy products By physically active: walk 3 x a week by self or with daughter  Follow up cholesterol in February.

## 2011-11-21 NOTE — Progress Notes (Signed)
  Subjective:    Patient ID: Elaine Brown, female    DOB: [birth year], 41 y.o.   MRN: [id number]  HPI  DIABETES Disease Monitoring: Blood Sugar ranges-105-200 Polyuria/phagia/dipsia- no      Visual problems- no  Medications: Compliance- Pt reports taking medications as prescribed without missing any days. Hypoglycemic symptoms- no  HYPERTENSION Disease Monitoring Home BP Monitoring not doing Chest pain- no     Dyspnea-  no  Medications Compliance: taking as prescribed. Lightheadedness-  no  Edema-  no   I reviewed previsit questionnaire and the above is accurate CHAMBLISS,MARSHALL L   ROS - See HPI  PMH Lab Review   Potassium  Date Value Range Status  08/02/2011 4.2  3.5 - 5.3 mEq/L Final     Sodium  Date Value Range Status  08/02/2011 136  135 - 145 mEq/L Final       OBESITY Current weight/BMI : Body mass index is 38.45 kg/(m^2).    How long have been obese:  10 + yrs Course:  Some improvment Problems or symptoms it causes:  DM, HTN, knee pain   Things have tried to improve:  Portion control, dietary changes  Patient Identified Concern:  DM management, weight loss Stage of Change Patient Is In:  Preparation- making/planning changes for less than 6 months. Patient Reported Barriers:  Habits, family concerns Patient Reported Perceived Benefits:  Feeling better, not taking medications, no knee pain Patient Reports Self-Efficacy:   Pt displays some self-efficacy due to past successes. Behavior Change Supports:  Not many, family is supportive but does not help with changes, does not have many people "helping" her. Goals:  To walk 3 times a week during the early afternoon. Patient Education:  We talked about diet, modified carbohydrates due to DM. Importance of exercise and ways to create habit in her life.   [facility] CONDITIONS DM HTN  Care Plan  Treatment goals met: treatment goals met-maintain self-care plan Self-Care goals/plans: Manage my medications: take my  medications as prescribed Monitor my health: keep track of my blood glucose, keep track of my blood pressure Eat health foods: drink diet soda or water instead of juice, eat baked foods instead of fried foods By physically active: walk 3 x a week by self or with daughter Patient confidence that self-care goals can be met: 6   A1C: at goal 6.9 BP: at goal 107-72 Barriers to progress toward treatment goals: lack of social supports, no habit of exercise Diabetes tools:  Home glucose logbook, home glucose meter Hypertension tools: instructions on where to check bp, such as [employer], and instruction on keeping record. Diabetes education: discussion with health coach and offered opportunities for further education with dietician Hypertension education: discussion with health coach.  Referrals made to community resources: none, pt could not identify a need Health beahvior counseling provided: dm, bp, obesity Patient understanding of medications: understands reason for each medication Patient is taking medications as prescribed: Yes Barriers to taking medications: none idenfied    Review of Systems     Objective:   Physical Exam No acute distress.        Assessment & Plan:

## 2011-11-21 NOTE — Assessment & Plan Note (Signed)
Well controlled continue current treatment.

## 2011-11-21 NOTE — Assessment & Plan Note (Signed)
Assessment: some improvement 4 lbs lost  Plan: Continued health coaching, continued reduction of carbohydrates.  Set goal to walk 3x a week by self or with daughter. Follow up in 5 months for cholesterol/ weight loss.

## 2011-12-18 ENCOUNTER — Other Ambulatory Visit: Payer: Self-pay | Admitting: Family Medicine

## 2012-03-17 ENCOUNTER — Other Ambulatory Visit: Payer: Self-pay

## 2012-06-19 ENCOUNTER — Other Ambulatory Visit: Payer: Self-pay | Admitting: Family Medicine

## 2012-06-26 ENCOUNTER — Other Ambulatory Visit: Payer: Self-pay | Admitting: Family Medicine

## 2012-08-06 ENCOUNTER — Encounter: Payer: Self-pay | Admitting: Family Medicine

## 2012-08-06 ENCOUNTER — Ambulatory Visit (INDEPENDENT_AMBULATORY_CARE_PROVIDER_SITE_OTHER): Payer: PRIVATE HEALTH INSURANCE | Admitting: Family Medicine

## 2012-08-06 VITALS — BP 157/82 | HR 104 | Temp 99.2°F | Ht 64.0 in | Wt 226.0 lb

## 2012-08-06 DIAGNOSIS — N943 Premenstrual tension syndrome: Secondary | ICD-10-CM

## 2012-08-06 DIAGNOSIS — L280 Lichen simplex chronicus: Secondary | ICD-10-CM | POA: Insufficient documentation

## 2012-08-06 DIAGNOSIS — E785 Hyperlipidemia, unspecified: Secondary | ICD-10-CM

## 2012-08-06 DIAGNOSIS — E119 Type 2 diabetes mellitus without complications: Secondary | ICD-10-CM

## 2012-08-06 DIAGNOSIS — I10 Essential (primary) hypertension: Secondary | ICD-10-CM

## 2012-08-06 DIAGNOSIS — L989 Disorder of the skin and subcutaneous tissue, unspecified: Secondary | ICD-10-CM

## 2012-08-06 MED ORDER — HYDROCORTISONE 2.5 % EX OINT: TOPICAL_OINTMENT | Freq: Two times a day (BID) | CUTANEOUS | Status: DC

## 2012-08-06 NOTE — Patient Instructions (Addendum)
Come if for your fasting blood tests and I will contact you  Keep work on the weight - aim to lose 2 lbs a week  Check your blood pressure regularly should be < 140/90.  If regularly above that then contact me  Use the ointment whenever you itch - Rub dont' scratch  Come back in 3 months

## 2012-08-06 NOTE — Progress Notes (Signed)
Patient Identified Concern:  Taking time to care for her self, dm managment Stage of Change Patient Is In:  Preparation, pt has been starting and stopping health behaviors for 1+ year Patient Reported Barriers:  Habits, busy home life Patient Reported Perceived Benefits:  Feeling better, weight loss Patient Reports Self-Efficacy:   Pt verbalized some self-efficacy with success she has had in the past Behavior Change Supports:  Husband, dietician, counselor Goals:  To move more, find a physical activity she enjoys doing, fu with health coach in 2 weeks. Patient Education:  We reviewed things she is doing to take care of her self; include drinking only water or unsweetened ice tea, reducing "breads".

## 2012-08-07 NOTE — Assessment & Plan Note (Signed)
Discussed a trial if increasing her prozac

## 2012-08-07 NOTE — Assessment & Plan Note (Signed)
Fair control.  She will continue to work on her weight

## 2012-08-07 NOTE — Assessment & Plan Note (Signed)
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  Recommend hydrocortisone ointment regularly

## 2012-08-07 NOTE — Progress Notes (Signed)
  Subjective:    Patient ID: Elaine Brown, female    DOB: [birth year], 42 y.o.   MRN: [id number]  HPI  DEPRESSION Feeling stressed and anxious sometimes with family issues.  Makes her scratch her skin and interferes with sleep.  No appetite issues  No down symptoms or suicidal ideation. Prozac seems to help has not tried higher doses.    She does not think it causes any side effects  DIABETES Disease Monitoring: Blood Sugar ranges-not checkig Polyuria/phagia/dipsia- no      Visual problems- no Medications: Compliance- daily metoformin Hypoglycemic symptoms- no  HYPERTENSION Disease Monitoring Home BP Monitoring not checking Chest pain- no    Dyspnea- no Medications Compliance-  Daily prinzide. Lightheadedness-  no  Edema- mild at end of day worse during summer ROS - See HPI  PMH Lab Review   Potassium  Date Value Range Status  08/02/2011 4.2  3.5 - 5.3 mEq/L Final     Sodium  Date Value Range Status  08/02/2011 136  135 - 145 mEq/L Final     Creat  Date Value Range Status  08/02/2011 0.62  0.50 - 1.10 mg/dL Final     Creatinine, Ser  Date Value Range Status  05/07/2009 0.60  0.40-1.20 mg/dL Final         Review of Symptoms - see HPI  PMH - Smoking status noted.       Review of Systems     Objective:   Physical Exam Alert inteactive well groomed Heart - Regular rate and rhythm.  No murmurs, gallops or rubs.    Lungs:  Normal respiratory effort, chest expands symmetrically. Lungs are clear to auscultation, no crackles or wheezes. Extrem - trace edema at ankles  Skin - areas of mild excoriation over extensor surfaces all all extremities - not infected appearing       Assessment & Plan:

## 2012-08-07 NOTE — Assessment & Plan Note (Addendum)
Not at goal.  She wold like to see if can lower with weight loss.  She has cuff at home and will monitor

## 2012-08-09 ENCOUNTER — Other Ambulatory Visit: Payer: Self-pay

## 2012-08-29 ENCOUNTER — Encounter: Payer: Self-pay | Admitting: Home Health Services

## 2012-09-07 ENCOUNTER — Other Ambulatory Visit: Payer: Self-pay | Admitting: Family Medicine

## 2012-09-20 ENCOUNTER — Telehealth: Payer: Self-pay | Admitting: Home Health Services

## 2012-09-20 ENCOUNTER — Other Ambulatory Visit: Payer: Self-pay | Admitting: *Deleted

## 2012-09-20 MED ORDER — FLUOXETINE HCL 20 MG PO CAPS: ORAL_CAPSULE | ORAL | Status: DC

## 2012-09-20 NOTE — Telephone Encounter (Signed)
Pt states that she is out of 20 mg and Dr. Deirdre Priest wanted to move it up to 40 mg - can a new 90 day supply be prescribed to [facility] on [address]?Thanks Wyatt Haste, RN-BSN

## 2012-09-20 NOTE — Telephone Encounter (Signed)
Spoke with [employer].  Pt reports taking all medications as prescribed.  Has not been monitoring blood sugar at home.  Pt reports no regular exercise or changes in diet.  Pt shared that she and her husband are making a plan to work on weight loss together and will call me back when the details are discussed.  Pt's overall goal is weight loss, dm management

## 2012-09-21 MED ORDER — FLUOXETINE HCL 40 MG PO CAPS: 40.0000 mg | ORAL_CAPSULE | Freq: Every day | ORAL | Status: DC

## 2012-09-21 NOTE — Addendum Note (Signed)
Addended by: Pearlean Brownie L on: 09/21/2012 02:10 AM   Modules accepted: Orders

## 2012-09-28 ENCOUNTER — Other Ambulatory Visit: Payer: Self-pay | Admitting: Family Medicine

## 2012-12-06 ENCOUNTER — Other Ambulatory Visit: Payer: Self-pay

## 2012-12-24 ENCOUNTER — Other Ambulatory Visit: Payer: Self-pay | Admitting: Family Medicine

## 2012-12-24 MED ORDER — METFORMIN HCL 1000 MG PO TABS: 1000.0000 mg | ORAL_TABLET | Freq: Two times a day (BID) | ORAL | Status: DC

## 2013-03-22 ENCOUNTER — Telehealth: Payer: Self-pay

## 2013-03-22 NOTE — Telephone Encounter (Signed)
Left message for pt to schedule appointment. Pt needs LDL./MS

## 2013-04-15 ENCOUNTER — Other Ambulatory Visit: Payer: Self-pay | Admitting: *Deleted

## 2013-04-15 MED ORDER — LISINOPRIL-HYDROCHLOROTHIAZIDE 20-12.5 MG PO TABS: 1.0000 | ORAL_TABLET | Freq: Every day | ORAL | Status: DC

## 2013-05-09 ENCOUNTER — Other Ambulatory Visit: Payer: Self-pay

## 2013-06-19 ENCOUNTER — Encounter: Payer: Self-pay | Admitting: Home Health Services

## 2013-06-19 ENCOUNTER — Encounter: Payer: Self-pay | Admitting: Family Medicine

## 2013-06-19 ENCOUNTER — Ambulatory Visit (INDEPENDENT_AMBULATORY_CARE_PROVIDER_SITE_OTHER): Payer: No Typology Code available for payment source | Admitting: Family Medicine

## 2013-06-19 VITALS — BP 124/84 | HR 98 | Temp 98.4°F | Wt 225.0 lb

## 2013-06-19 DIAGNOSIS — E785 Hyperlipidemia, unspecified: Secondary | ICD-10-CM

## 2013-06-19 DIAGNOSIS — E119 Type 2 diabetes mellitus without complications: Secondary | ICD-10-CM

## 2013-06-19 DIAGNOSIS — N943 Premenstrual tension syndrome: Secondary | ICD-10-CM

## 2013-06-19 DIAGNOSIS — I10 Essential (primary) hypertension: Secondary | ICD-10-CM

## 2013-06-19 LAB — BASIC METABOLIC PANEL
BUN: 9 mg/dL (ref 6–23)
CHLORIDE: 99 meq/L (ref 96–112)
CO2: 26 mEq/L (ref 19–32)
Calcium: 9.7 mg/dL (ref 8.4–10.5)
Creat: 0.67 mg/dL (ref 0.50–1.10)
GLUCOSE: 114 mg/dL — AB (ref 70–99)
POTASSIUM: 4.3 meq/L (ref 3.5–5.3)
Sodium: 134 mEq/L — ABNORMAL LOW (ref 135–145)

## 2013-06-19 LAB — LIPID PANEL
Cholesterol: 169 mg/dL (ref 0–200)
HDL: 50 mg/dL (ref >39)
LDL CALC: 90 mg/dL (ref 0–99)
Total CHOL/HDL Ratio: 3.4 Ratio
Triglycerides: 143 mg/dL (ref <150)
VLDL: 29 mg/dL (ref 0–40)

## 2013-06-19 LAB — POCT GLYCOSYLATED HEMOGLOBIN (HGB A1C): Hemoglobin A1C: 7.6

## 2013-06-19 NOTE — Assessment & Plan Note (Signed)
Reasonable control Continue current medications - work on weight loss

## 2013-06-19 NOTE — Assessment & Plan Note (Signed)
Stable although her skin scratching is not well controlled

## 2013-06-19 NOTE — Progress Notes (Signed)
   Subjective:    Patient ID: Elaine Brown, female    DOB: 05/02/1970, 43 y.o.   MRN: [id number]  HPI  DEPRESSION Feeling stressed and anxious sometimes with family issues.  And church matters.  Reasonably stable.  Does scratch her skin on arms and legs and chin when stressed.  Feesl she has reasonable sleep and appetite  DIABETES Disease Monitoring: Blood Sugar ranges-not checkig Polyuria/phagia/dipsia- no      Visual problems- no Medications: Compliance- daily metoformin Hypoglycemic symptoms- no  HYPERTENSION Disease Monitoring Home BP Monitoring not checking Chest pain- no    Dyspnea- no Medications Compliance-  Daily prinzide. Lightheadedness-  no  Edema- mild at end of day worse during summer ROS - See HPI   Review of Systems     Objective:   Physical Exam Heart - Regular rate and rhythm.  No murmurs, gallops or rubs.    Lungs:  Normal respiratory effort, chest expands symmetrically. Lungs are clear to auscultation, no crackles or wheezes. Extremities:  No cyanosis, edema, or deformity noted with good range of motion of all major joints.   Skin        Assessment & Plan:

## 2013-06-19 NOTE — Progress Notes (Signed)
Patient Identified Concern:  Weight loss Stage of Change Patient Is In:  Preparation- has been making small changes for over 6 months. Patient Reported Barriers:  Old habits, food preferences Patient Reported Perceived Benefits:  Feeling better, better health, better control of DM and weight maintenance for family. Patient Reports Self-Efficacy:   Pt self reports some self efficacy.  Unable to actualize plans at time but has knowledge base of healthy lifestyles.  Behavior Change Supports:  Husband is trying to make lifestyle changes since hospitalization a few months ago. Goals:  To continue limiting soft drinks, monitoring carbohydrates.  Patient Education:  We discussed carbohydrates and spacing them out the day.  We talked about starting exercise routine and other changes her family has started to make for weight loss and healthy lifestyle.   Pt scheduled follow up appointment for health coaching with husband and daughter for June 2015.

## 2013-06-19 NOTE — Assessment & Plan Note (Signed)
Good control.  Check labs 

## 2013-06-19 NOTE — Patient Instructions (Addendum)
Good to see you today!  Thanks for coming in.  I will call you if your tests are not good.  Otherwise I will send you a letter.  If you do not hear from me with in 2 weeks please call our office.     Come back in 3 months!  Continue with not drinking sugary drinks, limiting fried foods and increasing your fruits and vegetables daily.  Call back and schedule health coaching appointment with Rosalita ChessmanSuzanne [phone]) for both you and your husband.

## 2013-06-20 ENCOUNTER — Encounter: Payer: Self-pay | Admitting: Family Medicine

## 2013-06-21 NOTE — Telephone Encounter (Signed)
Opened in error

## 2013-07-09 ENCOUNTER — Ambulatory Visit: Payer: No Typology Code available for payment source | Admitting: Home Health Services

## 2013-07-31 ENCOUNTER — Ambulatory Visit: Payer: No Typology Code available for payment source | Admitting: Family Medicine

## 2013-08-09 ENCOUNTER — Ambulatory Visit: Payer: No Typology Code available for payment source | Admitting: Family Medicine

## 2013-08-15 ENCOUNTER — Ambulatory Visit: Payer: No Typology Code available for payment source | Admitting: Home Health Services

## 2013-08-20 ENCOUNTER — Ambulatory Visit: Payer: No Typology Code available for payment source | Admitting: Home Health Services

## 2013-08-20 ENCOUNTER — Ambulatory Visit (INDEPENDENT_AMBULATORY_CARE_PROVIDER_SITE_OTHER): Payer: No Typology Code available for payment source | Admitting: Family Medicine

## 2013-08-20 ENCOUNTER — Encounter: Payer: Self-pay | Admitting: Family Medicine

## 2013-08-20 VITALS — BP 145/85 | HR 92 | Temp 99.0°F | Wt 220.0 lb

## 2013-08-20 DIAGNOSIS — M25569 Pain in unspecified knee: Secondary | ICD-10-CM

## 2013-08-20 DIAGNOSIS — L989 Disorder of the skin and subcutaneous tissue, unspecified: Secondary | ICD-10-CM

## 2013-08-20 DIAGNOSIS — M25561 Pain in right knee: Secondary | ICD-10-CM

## 2013-08-20 MED ORDER — HYDROCORTISONE 2.5 % EX OINT: TOPICAL_OINTMENT | Freq: Two times a day (BID) | CUTANEOUS | Status: DC

## 2013-08-20 NOTE — Patient Instructions (Signed)
Good to see you today!  Thanks for coming in.  For Knee pain Start with Tylenol 2 xtra strength up to three times daily as needed  If not helping enough then Diclofenac twice daily  Will get an xray and send you the results  If the knee pain is not controlled come back for an injection  Check with Rosalita ChessmanSuzanne about the eye exam

## 2013-08-20 NOTE — Assessment & Plan Note (Signed)
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  Has never had xray before.  Prior aspiration 2 years ago showed no gout or infection. Treat with analgesics Reserve injection if this fails given her difficulty with weight and diabetes

## 2013-08-20 NOTE — Progress Notes (Signed)
Patient ID: Vickki HearingJanie L [place]burg, female   DOB: 01/15/1971, 43 y.o.   MRN: [id number] JOINT PAIN  Location: R knee Pain started: has had on and off for several years.  Swells intermittently Pain is: achy Severity: soemtimes keeps her from bending Medications tried: diclofenac and vicoden in past Recent trauma: no  Symptoms Redness:no Swelling:yes Joint locking: no Fever: no Rash: no Weakness: no Weight loss: no Exposure to STD: no  PMH Had aspiration of knee 2 years ago  Chief Complaint noted Review of Symptoms - see HPI PMH - Smoking status noted.   Vital Signs reviewed   Review of Systems     Objective:   Physical Exam  R knee - obvious effusion. FROM,  mild diffuse tenderness.  No distal edema or hip or ankle pain Able to walk around room and get on exam table

## 2013-08-21 ENCOUNTER — Ambulatory Visit: Payer: No Typology Code available for payment source | Admitting: Home Health Services

## 2013-09-05 ENCOUNTER — Ambulatory Visit
Admission: RE | Admit: 2013-09-05 | Discharge: 2013-09-05 | Disposition: A | Discharge status: Home / Self Care | Payer: No Typology Code available for payment source | Source: Ambulatory Visit | Attending: Family Medicine | Admitting: Family Medicine

## 2013-09-05 DIAGNOSIS — M25561 Pain in right knee: Secondary | ICD-10-CM

## 2013-09-06 ENCOUNTER — Other Ambulatory Visit: Payer: Self-pay | Admitting: *Deleted

## 2013-09-07 ENCOUNTER — Other Ambulatory Visit: Payer: Self-pay | Admitting: Family Medicine

## 2013-09-09 ENCOUNTER — Telehealth: Payer: Self-pay | Admitting: Home Health Services

## 2013-09-09 MED ORDER — LISINOPRIL-HYDROCHLOROTHIAZIDE 20-12.5 MG PO TABS: 1.0000 | ORAL_TABLET | Freq: Every day | ORAL | Status: DC

## 2013-09-09 MED ORDER — FLUOXETINE HCL 40 MG PO CAPS
40.0000 mg | ORAL_CAPSULE | Freq: Every day | ORAL | Status: DC
Start: ? — End: 2014-07-23

## 2013-09-09 NOTE — Telephone Encounter (Signed)
Spoke with [employer].  Pt reports having created weekly meal plans for the past two weeks.  This strategies seems to be helping with not eating out and portion sizes.  Pt reports satisfaction of doing this with family.  Pt self reports weight loss in both herself and her husband.   Pt reports not getting in daily vegetables.  Goals for next week are to continue with weekly meal planning and to write 1 vegetable a day into this menu. Continue with only eating 1 plate of food.  Pt and family schedule follow up visit with me on 8/18 to discuss continued changes.

## 2013-09-10 ENCOUNTER — Encounter: Payer: Self-pay | Admitting: Family Medicine

## 2013-09-17 ENCOUNTER — Telehealth: Payer: Self-pay | Admitting: Home Health Services

## 2013-09-17 ENCOUNTER — Other Ambulatory Visit: Payer: Self-pay | Admitting: *Deleted

## 2013-09-17 MED ORDER — METFORMIN HCL 1000 MG PO TABS: 1000.0000 mg | ORAL_TABLET | Freq: Two times a day (BID) | ORAL | Status: DC

## 2013-09-17 NOTE — Telephone Encounter (Signed)
Pt weighed 211.8 today.    Goals:  Weekly menus and shopping, 1 vegetable a day, and portion sizes.    Family is all working on weight loss together.

## 2013-09-17 NOTE — Telephone Encounter (Signed)
Pt provided CBG values for several days as follows:  09/11/13 fasting  120  2 hours later 174 09/12/13 fasting 120  felt weak 70 09/13/13 fasting 132 09/14/13 fasting 103 09/15/13 fasting  94 09/16/13 fasting 100 09/17/13 fasting 120

## 2013-12-18 ENCOUNTER — Ambulatory Visit (INDEPENDENT_AMBULATORY_CARE_PROVIDER_SITE_OTHER): Payer: No Typology Code available for payment source | Admitting: Family Medicine

## 2013-12-18 ENCOUNTER — Encounter: Payer: Self-pay | Admitting: Family Medicine

## 2013-12-18 ENCOUNTER — Other Ambulatory Visit (HOSPITAL_COMMUNITY)
Admission: RE | Admit: 2013-12-18 | Discharge: 2013-12-18 | Disposition: A | Discharge status: Home / Self Care | Payer: No Typology Code available for payment source | Source: Ambulatory Visit | Attending: Family Medicine | Admitting: Family Medicine

## 2013-12-18 VITALS — BP 136/89 | HR 102 | Temp 98.6°F | Ht 64.0 in | Wt 216.0 lb

## 2013-12-18 DIAGNOSIS — L989 Disorder of the skin and subcutaneous tissue, unspecified: Secondary | ICD-10-CM

## 2013-12-18 DIAGNOSIS — I10 Essential (primary) hypertension: Secondary | ICD-10-CM

## 2013-12-18 DIAGNOSIS — Z124 Encounter for screening for malignant neoplasm of cervix: Secondary | ICD-10-CM

## 2013-12-18 DIAGNOSIS — Z1151 Encounter for screening for human papillomavirus (HPV): Secondary | ICD-10-CM | POA: Diagnosis present

## 2013-12-18 DIAGNOSIS — E119 Type 2 diabetes mellitus without complications: Secondary | ICD-10-CM

## 2013-12-18 DIAGNOSIS — Z01419 Encounter for gynecological examination (general) (routine) without abnormal findings: Secondary | ICD-10-CM | POA: Insufficient documentation

## 2013-12-18 LAB — POCT GLYCOSYLATED HEMOGLOBIN (HGB A1C): Hemoglobin A1C: 6.9

## 2013-12-18 NOTE — Assessment & Plan Note (Signed)
Not improving. No signs of infection. Encourage to use lubrication regularly.

## 2013-12-18 NOTE — Patient Instructions (Addendum)
Good to see you today!  Thanks for coming in.  You are doing great!  Keep taking all your medications as you are  See your eye doctor and ask them to send me a report  I hope the surgery and the holidays go well.  Keep your arms very moist with vaseline - use it at least twice daily but the more the better  Come back in 3 months

## 2013-12-18 NOTE — Progress Notes (Signed)
   Subjective:    Patient ID: Elaine Brown, female    DOB: 03/06/1970, 43 y.o.   MRN: [id number]  HPI  HYPERTENSION Disease Monitoring: Blood pressure range-not checking Chest pain, palpitations- non      Dyspnea- o Medications: Compliance- daily Lightheadedness,Syncope- no   Edema- no  DIABETES Disease Monitoring: Blood Sugar ranges-not chcking Polyuria/phagia/dipsia- no      Visual problems- no Medications: Compliance- metformin and weight loss Hypoglycemic symptoms- no  Arm Excoriations Still a problem.  No fever or redness or discharge.  Picks worse at PM. Using ointment only intermittently   Monitoring Labs and Parameters Last A1C:  Lab Results  Component Value Date   HGBA1C 6.9 12/18/2013    Last Lipid:     Component Value Date/Time   CHOL 169 06/19/2013 0935   HDL 50 06/19/2013 0935    Last Bmet  POTASSIUM  Date Value Ref Range Status  06/19/2013 4.3 3.5 - 5.3 mEq/L Final   SODIUM  Date Value Ref Range Status  06/19/2013 134* 135 - 145 mEq/L Final   CREAT  Date Value Ref Range Status  06/19/2013 0.67 0.50 - 1.10 mg/dL Final   CREATININE, SER  Date Value Ref Range Status  05/07/2009 0.60 0.40-1.20 mg/dL Final      Chief Complaint noted Review of Symptoms - see HPI PMH - Smoking status noted.   Vital Signs reviewed    Review of Systems     Objective:   Physical Exam  Alert no acute distress Heart - Regular rate and rhythm.  No murmurs, gallops or rubs.    Lungs:  Normal respiratory effort, chest expands symmetrically. Lungs are clear to auscultation, no crackles or wheezes. Genitalia:  Normal introitus for age, no external lesions, no vaginal discharge, mucosa pink and moist, no vaginal or cervical lesions, no vaginal atrophy, no friaility or hemorrhage, normal uterus size and position, no adnexal masses or tenderness  Exam limited due to habitus       Assessment & Plan:

## 2013-12-18 NOTE — Assessment & Plan Note (Signed)
Reasonable control.  Should improve as continues weight loss

## 2013-12-18 NOTE — Assessment & Plan Note (Signed)
Good control

## 2013-12-19 LAB — CYTOLOGY - PAP

## 2014-02-05 LAB — HM DIABETES EYE EXAM

## 2014-02-26 ENCOUNTER — Encounter: Payer: Self-pay | Admitting: Family Medicine

## 2014-04-23 ENCOUNTER — Encounter: Payer: Self-pay | Admitting: Family Medicine

## 2014-04-23 ENCOUNTER — Ambulatory Visit (INDEPENDENT_AMBULATORY_CARE_PROVIDER_SITE_OTHER): Payer: No Typology Code available for payment source | Admitting: Family Medicine

## 2014-04-23 VITALS — BP 128/85 | HR 101 | Temp 99.1°F | Ht 64.0 in | Wt 220.0 lb

## 2014-04-23 DIAGNOSIS — M25561 Pain in right knee: Secondary | ICD-10-CM

## 2014-04-23 DIAGNOSIS — I10 Essential (primary) hypertension: Secondary | ICD-10-CM | POA: Diagnosis not present

## 2014-04-23 DIAGNOSIS — L280 Lichen simplex chronicus: Secondary | ICD-10-CM

## 2014-04-23 DIAGNOSIS — E119 Type 2 diabetes mellitus without complications: Secondary | ICD-10-CM | POA: Diagnosis not present

## 2014-04-23 LAB — POCT GLYCOSYLATED HEMOGLOBIN (HGB A1C): HEMOGLOBIN A1C: 7.2

## 2014-04-23 NOTE — Progress Notes (Signed)
   Subjective:    Patient ID: Elaine Brown, female    DOB: 07/19/1970, 44 y.o.   MRN: [id number]  HPI  HYPERTENSION Disease Monitoring: Blood pressure range-not checking  Chest pain, palpitations- no      Dyspnea- no Medications: Compliance- daily Lightheadedness,Syncope- no   Edema- no  DIABETES Disease Monitoring: Blood Sugar ranges-not checking Polyuria/phagia/dipsia- no      Visual problems- no Medications: Compliance- daily twice daily  Hypoglycemic symptoms- no  Skin Lesions Has excoriatons on legs and arms.  Long standing.  Not using any creams or lubrication.  No fever or discharge   Monitoring Labs and Parameters Last A1C:  Lab Results  Component Value Date   HGBA1C 7.2 04/23/2014    Last Lipid:     Component Value Date/Time   CHOL 169 06/19/2013 0935   HDL 50 06/19/2013 0935    Last Bmet  POTASSIUM  Date Value Ref Range Status  06/19/2013 4.3 3.5 - 5.3 mEq/L Final   SODIUM  Date Value Ref Range Status  06/19/2013 134* 135 - 145 mEq/L Final   CREAT  Date Value Ref Range Status  06/19/2013 0.67 0.50 - 1.10 mg/dL Final   CREATININE, SER  Date Value Ref Range Status  05/07/2009 0.60 0.40-1.20 mg/dL Final      Last BPs:  BP Readings from Last 3 Encounters:  04/23/14 128/85  12/18/13 136/89  08/20/13 145/85    Chief Complaint noted Review of Symptoms - see HPI PMH - Smoking status noted.   Vital Signs reviewed    Review of Systems     Objective:   Physical Exam  Heart - Regular rate and rhythm.  No murmurs, gallops or rubs.    Lungs:  Normal respiratory effort, chest expands symmetrically. Lungs are clear to auscultation, no crackles or wheezes. Skin - on forearms and lower legs only.  Numerous excoriations in different stages of healing.  No acute infection     Assessment & Plan:

## 2014-04-23 NOTE — Assessment & Plan Note (Signed)
Controlled.  Encourage weight loss

## 2014-04-23 NOTE — Assessment & Plan Note (Signed)
At goal. Continue current medications. 

## 2014-04-23 NOTE — Patient Instructions (Addendum)
Good to see you today!  Thanks for coming in.  Use vaseline twice daily every day especially at night  Rub not scratch  See a podiatrist   Come back in 3 months

## 2014-04-23 NOTE — Assessment & Plan Note (Signed)
Stable  Taught quad strengthing exercises

## 2014-04-23 NOTE — Assessment & Plan Note (Signed)
Most consistent with neurodermatitis with excoriations.  Discussed consistent use of lubricants

## 2014-05-08 ENCOUNTER — Other Ambulatory Visit: Payer: Self-pay | Admitting: *Deleted

## 2014-05-08 MED ORDER — LISINOPRIL-HYDROCHLOROTHIAZIDE 20-12.5 MG PO TABS: 1.0000 | ORAL_TABLET | Freq: Every day | ORAL | Status: DC

## 2014-05-13 ENCOUNTER — Ambulatory Visit (INDEPENDENT_AMBULATORY_CARE_PROVIDER_SITE_OTHER): Payer: No Typology Code available for payment source | Admitting: Podiatry

## 2014-05-13 ENCOUNTER — Encounter: Payer: Self-pay | Admitting: Podiatry

## 2014-05-13 ENCOUNTER — Ambulatory Visit: Payer: No Typology Code available for payment source

## 2014-05-13 VITALS — BP 140/84 | HR 109 | Resp 16

## 2014-05-13 DIAGNOSIS — R52 Pain, unspecified: Secondary | ICD-10-CM

## 2014-05-13 DIAGNOSIS — L603 Nail dystrophy: Secondary | ICD-10-CM | POA: Diagnosis not present

## 2014-05-13 NOTE — Progress Notes (Signed)
   Subjective:    Patient ID: Elaine Brown, female    DOB: 10/11/1970, 44 y.o.   MRN: [id number]  HPI Comments: "I have a problem with the toenails"  Patient c/o tender 2nd toes bilateral for several months. The toenails are thick and discolored. The 2nd nail right is curled. She has tried trimming them, but makes sore.      Review of Systems  Musculoskeletal: Positive for arthralgias and gait problem.  All other systems reviewed and are negative.      Objective:   Physical Exam: I have reviewed her past medical history medications allergy surgery social history and review of systems. Pulses are strongly palpable bilateral. Neurologic sensorium is intact bilateral. Deep tendon reflexes are intact bilaterally muscle strength +5 over 5 dorsiflexion plantar flexors and inverters and everters all intrinsic musculature is intact. Orthopedic evaluation demonstrates all joints distal to the ankle have full range of motion without crepitation. Cutaneous evaluation demonstrates supple well-hydrated cutis with exception of nail dystrophy to the second digits bilateral with mild hammertoe deformity. These nail plates are thick yellow incurvated and malformed. They're painful on palpation as well as debridement.        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 Possible onychomycosis.  Plan: Debrided nails second digit bilateral.

## 2014-07-23 ENCOUNTER — Encounter: Payer: Self-pay | Admitting: Family Medicine

## 2014-07-23 ENCOUNTER — Ambulatory Visit (INDEPENDENT_AMBULATORY_CARE_PROVIDER_SITE_OTHER): Payer: No Typology Code available for payment source | Admitting: Family Medicine

## 2014-07-23 VITALS — BP 143/83 | HR 88 | Temp 98.3°F | Ht 64.0 in | Wt 225.8 lb

## 2014-07-23 DIAGNOSIS — E119 Type 2 diabetes mellitus without complications: Secondary | ICD-10-CM | POA: Diagnosis not present

## 2014-07-23 DIAGNOSIS — E669 Obesity, unspecified: Secondary | ICD-10-CM | POA: Diagnosis not present

## 2014-07-23 DIAGNOSIS — E785 Hyperlipidemia, unspecified: Secondary | ICD-10-CM | POA: Diagnosis not present

## 2014-07-23 DIAGNOSIS — I10 Essential (primary) hypertension: Secondary | ICD-10-CM

## 2014-07-23 LAB — POCT GLYCOSYLATED HEMOGLOBIN (HGB A1C): Hemoglobin A1C: 7.7

## 2014-07-23 MED ORDER — FLUOXETINE HCL 20 MG PO TABS: 20.0000 mg | ORAL_TABLET | Freq: Every day | ORAL | Status: DC

## 2014-07-23 NOTE — Assessment & Plan Note (Signed)
Not at goal.  Hopefully will decrease with weight loss.  If not may need to increase medications

## 2014-07-23 NOTE — Progress Notes (Addendum)
HYPERTENSION Disease Monitoring: Blood pressure range-not checking Chest pain, palpitations- no      Dyspnea- no Medications: Compliance- daily meds Lightheadedness,Syncope- no   Edema- no  DIABETES Disease Monitoring: Blood Sugar ranges-not checking Polyuria/phagia/dipsia- no      Visual problems- no Medications: Compliance- twice daily  metformin  Hypoglycemic symptoms- no  Obesity Has gained 5 lbs.  Trying to watch diet but no specific plan.  Does believe her weight effefcts her knees and may cause heart disease in future.   Monitoring Labs and Parameters Last A1C:  Lab Results  Component Value Date   HGBA1C 7.7 07/23/2014    Last Lipid:     Component Value Date/Time   CHOL 169 06/19/2013 0935   HDL 50 06/19/2013 0935    Last Bmet  POTASSIUM  Date Value Ref Range Status  06/19/2013 4.3 3.5 - 5.3 mEq/L Final   SODIUM  Date Value Ref Range Status  06/19/2013 134* 135 - 145 mEq/L Final   CREAT  Date Value Ref Range Status  06/19/2013 0.67 0.50 - 1.10 mg/dL Final   CREATININE, SER  Date Value Ref Range Status  05/07/2009 0.60 0.40-1.20 mg/dL Final      Last BPs:  BP Readings from Last 3 Encounters:  07/23/14 143/83  05/13/14 140/84  04/23/14 128/85    Chief Complaint noted Review of Symptoms - see HPI PMH - Smoking status noted.   Vital Signs reviewed   no apparent distress Diabetic Foot Check -  Appearance - no lesions, ulcers or calluses Skin - no unusual pallor or redness Monofilament testing -  Right - Great toe, medial, central, lateral ball and posterior foot intact Left - Great toe, medial, central, lateral ball and posterior foot intact

## 2014-07-23 NOTE — Assessment & Plan Note (Signed)
Sloightly worsened.  Will work on diet.  May increase metformin to maximum if not better next vsiit

## 2014-07-23 NOTE — Patient Instructions (Addendum)
Good to see you today!  Thanks for coming in.  Aim to lose 1-2 lbs a week. Cut back on sweets and bread - portion sizes  Come in for fasting labs  Try the lower dose of prozac.    Come back in 3 months

## 2014-07-23 NOTE — Assessment & Plan Note (Signed)
Worsened.  See AVS

## 2014-08-01 ENCOUNTER — Other Ambulatory Visit: Payer: Self-pay | Admitting: *Deleted

## 2014-08-01 MED ORDER — METFORMIN HCL 1000 MG PO TABS: 1000.0000 mg | ORAL_TABLET | Freq: Two times a day (BID) | ORAL | Status: DC

## 2014-08-07 ENCOUNTER — Encounter: Payer: Self-pay | Admitting: Family Medicine

## 2014-08-11 MED ORDER — DICLOFENAC SODIUM 75 MG PO TBEC: 75.0000 mg | DELAYED_RELEASE_TABLET | Freq: Two times a day (BID) | ORAL | Status: DC | PRN

## 2014-09-08 ENCOUNTER — Other Ambulatory Visit: Payer: Self-pay | Admitting: *Deleted

## 2014-09-09 MED ORDER — CALCIUM CARBONATE-VITAMIN D 600-400 MG-UNIT PO CHEW
1.0000 | CHEWABLE_TABLET | Freq: Every day | ORAL | Status: AC
Start: 2014-09-09 — End: ?

## 2014-11-14 ENCOUNTER — Other Ambulatory Visit: Payer: Self-pay | Admitting: *Deleted

## 2014-11-17 MED ORDER — FLUOXETINE HCL 20 MG PO TABS: 20.0000 mg | ORAL_TABLET | Freq: Every day | ORAL | Status: DC

## 2015-02-25 ENCOUNTER — Other Ambulatory Visit: Payer: Self-pay | Admitting: *Deleted

## 2015-02-26 MED ORDER — LISINOPRIL-HYDROCHLOROTHIAZIDE 20-12.5 MG PO TABS: 1.0000 | ORAL_TABLET | Freq: Every day | ORAL | Status: DC

## 2015-03-23 ENCOUNTER — Encounter: Payer: Self-pay | Admitting: Family Medicine

## 2015-03-23 ENCOUNTER — Ambulatory Visit (INDEPENDENT_AMBULATORY_CARE_PROVIDER_SITE_OTHER): Payer: BLUE CROSS/BLUE SHIELD | Admitting: Family Medicine

## 2015-03-23 VITALS — BP 140/83 | HR 96 | Temp 98.6°F | Ht 64.0 in | Wt 224.0 lb

## 2015-03-23 DIAGNOSIS — N943 Premenstrual tension syndrome: Secondary | ICD-10-CM | POA: Diagnosis not present

## 2015-03-23 DIAGNOSIS — E119 Type 2 diabetes mellitus without complications: Secondary | ICD-10-CM | POA: Diagnosis not present

## 2015-03-23 DIAGNOSIS — I10 Essential (primary) hypertension: Secondary | ICD-10-CM | POA: Diagnosis not present

## 2015-03-23 DIAGNOSIS — E785 Hyperlipidemia, unspecified: Secondary | ICD-10-CM | POA: Diagnosis not present

## 2015-03-23 DIAGNOSIS — R0602 Shortness of breath: Secondary | ICD-10-CM | POA: Insufficient documentation

## 2015-03-23 LAB — POCT GLYCOSYLATED HEMOGLOBIN (HGB A1C): Hemoglobin A1C: 9.2

## 2015-03-23 LAB — CBC
HEMATOCRIT: 39 % (ref 36.0–46.0)
HEMOGLOBIN: 12.3 g/dL (ref 12.0–15.0)
MCH: 25.7 pg — ABNORMAL LOW (ref 26.0–34.0)
MCHC: 31.5 g/dL (ref 30.0–36.0)
MCV: 81.4 fL (ref 78.0–100.0)
MPV: 11.2 fL (ref 8.6–12.4)
Platelets: 346 10*3/uL (ref 150–400)
RBC: 4.79 MIL/uL (ref 3.87–5.11)
RDW: 15.9 % — ABNORMAL HIGH (ref 11.5–15.5)
WBC: 6.5 10*3/uL (ref 4.0–10.5)

## 2015-03-23 MED ORDER — METFORMIN HCL 1000 MG PO TABS: ORAL_TABLET | ORAL | Status: DC

## 2015-03-23 MED ORDER — GLIPIZIDE 5 MG PO TABS: 5.0000 mg | ORAL_TABLET | Freq: Every day | ORAL | Status: DC

## 2015-03-23 NOTE — Assessment & Plan Note (Signed)
BP Readings from Last 3 Encounters:  03/23/15 140/83  07/23/14 143/83  05/13/14 140/84   At goal continue current medications Hopefully may decrease with diet improvement and weight rduction

## 2015-03-23 NOTE — Assessment & Plan Note (Addendum)
Was not at goal last check - will recheck labs.  Is not on statin

## 2015-03-23 NOTE — Assessment & Plan Note (Signed)
This seems most consistent with deconditioning and weight gain with no red flags for respiratory or cardiac disease.  Will check labs for anemia or metabolic causes and watch carefully

## 2015-03-23 NOTE — Progress Notes (Signed)
   Subjective:    Patient ID: Elaine Brown, female    DOB: 1970-12-21, 45 y.o.   MRN: [id number]  HPI  SHORTNESS OF BREATH  Has been short of breath for on off for months. Severity: sometimes has to stop when walking or working fast to catch her breath Short of breath with: exertion New Medications -no Kidney problems: no Heart problems: no History of cancer: no  Symptoms Fever: no Sputum: no Wheezing or asthma: no Leg swelling: no Chest Pain: no - never has chest pain with her shortness of breath  Immobility: no Stomach pain or black bowel movements: no Severe snoring or daytime sleepiness: Yes does snore and is getting worse - sometimes feels sleepy in afternoon Weight loss: no  ROS see HPI Smoking Status noted  HYPERTENSION Disease Monitoring: Blood pressure range-not checking Chest pain, palpitations- no      Dyspnea- see above Medications: Compliance- brings in her meds Lightheadedness,Syncope- no   Edema- no  DIABETES Disease Monitoring: Blood Sugar ranges-not checking Polyuria/phagia/dipsia- no      Visual problems- no Medications: Compliance- taking metformin twice daily Has been eating more sweets Hypoglycemic symptoms- no  HYPERLIPIDEMIA Disease Monitoring: See symptoms for Hypertension Medications: Compliance- not on statin  Right upper quadrant pain- no  Muscle aches- no  Monitoring Labs and Parameters Last A1C:  Lab Results  Component Value Date   HGBA1C 9.2 03/23/2015    Last Lipid:     Component Value Date/Time   CHOL 169 06/19/2013 0935   HDL 50 06/19/2013 0935    Last Bmet  POTASSIUM  Date Value Ref Range Status  06/19/2013 4.3 3.5 - 5.3 mEq/L Final   SODIUM  Date Value Ref Range Status  06/19/2013 134* 135 - 145 mEq/L Final   CREAT  Date Value Ref Range Status  06/19/2013 0.67 0.50 - 1.10 mg/dL Final   CREATININE, SER  Date Value Ref Range Status  05/07/2009 0.60 0.40-1.20 mg/dL Final      Last BPs:  BP Readings from  Last 3 Encounters:  03/23/15 140/83  07/23/14 143/83  05/13/14 140/84    Chief Complaint noted Review of Symptoms - see HPI PMH - Smoking status noted.   Vital Signs reviewed   Review of Systems     Objective:   Physical Exam Alert nad able to get up and down from exam table without assistance Heart - Regular rate and rhythm.  No murmurs, gallops or rubs.    Lungs:  Normal respiratory effort, chest expands symmetrically. Lungs are clear to auscultation, no crackles or wheezes. Extremities:  No cyanosis, edema, or deformity noted with good range of motion of all major joints.  , Does have mild pain and crepitus in right knee Back - tender right lateral lumbar parapin muscles without any vertebral body tenderness      Assessment & Plan:   Back Pain - no red flags of weakness or incontinence - seems musculoskeletal  Will monitor and treat with her currrent analgesics for her knee pain

## 2015-03-23 NOTE — Assessment & Plan Note (Signed)
Stable on Prozac.

## 2015-03-23 NOTE — Patient Instructions (Signed)
Good to see you today!  Thanks for coming in.  Diabetes   Increase metformin to 1.5 in am 1 in pm  Add glipizide 5 mg daily  Come back in 3 months   Shortness of breath   Will check labs  If sudden severe or does not go away go to the ER  For back and knee pain  Weight loss  Tylenol as needed

## 2015-03-23 NOTE — Assessment & Plan Note (Signed)
Worsened.  Will add glipizide and she will work on diet

## 2015-03-24 ENCOUNTER — Encounter: Payer: Self-pay | Admitting: Family Medicine

## 2015-03-24 LAB — COMPREHENSIVE METABOLIC PANEL
ALK PHOS: 72 U/L (ref 33–115)
ALT: 30 U/L — AB (ref 6–29)
AST: 33 U/L — AB (ref 10–30)
Albumin: 4 g/dL (ref 3.6–5.1)
BILIRUBIN TOTAL: 0.5 mg/dL (ref 0.2–1.2)
BUN: 12 mg/dL (ref 7–25)
CO2: 25 mmol/L (ref 20–31)
Calcium: 9 mg/dL (ref 8.6–10.2)
Chloride: 98 mmol/L (ref 98–110)
Creat: 0.65 mg/dL (ref 0.50–1.10)
Glucose, Bld: 203 mg/dL — ABNORMAL HIGH (ref 65–99)
POTASSIUM: 4.6 mmol/L (ref 3.5–5.3)
Sodium: 134 mmol/L — ABNORMAL LOW (ref 135–146)
TOTAL PROTEIN: 7.5 g/dL (ref 6.1–8.1)

## 2015-03-24 LAB — LIPID PANEL
CHOL/HDL RATIO: 4 ratio (ref <=5.0)
CHOLESTEROL: 170 mg/dL (ref 125–200)
HDL: 42 mg/dL — ABNORMAL LOW (ref >=46)
LDL Cholesterol: 102 mg/dL (ref <130)
Triglycerides: 131 mg/dL (ref <150)
VLDL: 26 mg/dL (ref <30)

## 2015-04-23 ENCOUNTER — Encounter: Payer: Self-pay | Admitting: Family Medicine

## 2015-04-23 ENCOUNTER — Ambulatory Visit (INDEPENDENT_AMBULATORY_CARE_PROVIDER_SITE_OTHER): Payer: BLUE CROSS/BLUE SHIELD | Admitting: Family Medicine

## 2015-04-23 VITALS — BP 147/83 | HR 97 | Temp 98.9°F | Ht 65.0 in | Wt 227.2 lb

## 2015-04-23 DIAGNOSIS — R6889 Other general symptoms and signs: Secondary | ICD-10-CM | POA: Diagnosis not present

## 2015-04-23 DIAGNOSIS — M791 Myalgia, unspecified site: Secondary | ICD-10-CM

## 2015-04-23 DIAGNOSIS — R059 Cough, unspecified: Secondary | ICD-10-CM

## 2015-04-23 DIAGNOSIS — R05 Cough: Secondary | ICD-10-CM

## 2015-04-23 LAB — INFLUENZA PANEL BY PCR (TYPE A & B)
H1N1FLUPCR: NOT DETECTED
INFLBPCR: POSITIVE — AB
Influenza A By PCR: NEGATIVE

## 2015-04-23 MED ORDER — OSELTAMIVIR PHOSPHATE 75 MG PO CAPS: 75.0000 mg | ORAL_CAPSULE | Freq: Two times a day (BID) | ORAL | Status: DC

## 2015-04-23 MED ORDER — PHENYLEPH-PROMETHAZINE-COD 5-6.25-10 MG/5ML PO SYRP: 5.0000 mL | ORAL_SOLUTION | Freq: Four times a day (QID) | ORAL | Status: DC | PRN

## 2015-04-23 MED ORDER — BENZONATATE 200 MG PO CAPS: 200.0000 mg | ORAL_CAPSULE | Freq: Two times a day (BID) | ORAL | Status: DC | PRN

## 2015-04-23 NOTE — Progress Notes (Signed)
   Subjective:    Patient ID: Elaine Brown, female    DOB: 07/02/1970, 45 y.o.   MRN: [id number]  Seen for Same day visit for   CC: Flulike symptoms  She reports sudden onset of nasal congestion, runny nose, sore throat, myalgias and nausea starting yesterday.  She reports members of her church have been recently diagnosed with the flu.  She was around them sunday and Monday.  Denies chest pain, shortness of breath.  Denies history of cardiac or respiratory problems.  Has diabetes mellitus, poorly controlled.  She has been taken Tylenol as needed for pain.  Reports coughing is getting worse and preventing her from sleeping.   Smoking history noted  Review of Systems   See HPI for ROS. Objective:  BP 147/83 mmHg  Pulse 97  Temp(Src) 98.9 F (37.2 C) (Oral)  Ht 5\' 5"  (1.651 m)  Wt 227 lb 3.2 oz (103.057 kg)  BMI 37.81 kg/m2  LMP 04/20/2015  General: NAD HEENT: Pharyngeal erythema without exudates; TMs clear bilaterally; thyroid nonpalpable; no cervical adenopathy Cardiac: RRR, normal heart sounds, no murmurs.  Respiratory: CTAB, normal effort Abdomen: soft, nontender, nondistended, Bowel sounds present Extremities: no edema or cyanosis. WWP. Skin: warm and dry, no rashes noted    Assessment & Plan:   1. Myalgia - Influenza panel by PCR (type A & B, H1N1) - Recommended Tylenol, Aleve as needed  2. Flu-like symptoms - Influenza PCR positive.  Recommended Tamiflu twice a day for 5 days given poorly controlled diabetes.  Also recommended she contact her son's pediatrician, given that he has asthma and developmental disabilities for consideration of prophylactic treatment - oseltamivir (TAMIFLU) 75 MG capsule; Take 1 capsule (75 mg total) by mouth 2 (two) times daily.  Dispense: 10 capsule; Refill: 0  3. Cough - Phenyleph-Promethazine-Cod 5-6.25-10 MG/5ML SYRP; Take 5 mLs by mouth every 6 (six) hours as needed.  Dispense: 180 mL; Refill: 0 - benzonatate (TESSALON) 200 MG capsule;  Take 1 capsule (200 mg total) by mouth 2 (two) times daily as needed for cough.  Dispense: 20 capsule; Refill: 0

## 2015-04-23 NOTE — Patient Instructions (Signed)
History symptoms are consistent with viral upper respiratory illness versus flu.  I will call you with the results of the flu swab.  - Take Tylenol 1000 mg every 8 hours as needed and/or Aleve or ibuprofen for body aches - Use Tessalon Perles for coughing.   - You can use codeine with Phenergan at night for coughing.  Do not drive on taking this medication - I recommend good hand hygiene and wearing a mask while around her son until you know if you have the flu or not

## 2015-05-07 DIAGNOSIS — Z0100 Encounter for examination of eyes and vision without abnormal findings: Secondary | ICD-10-CM | POA: Diagnosis not present

## 2015-05-07 DIAGNOSIS — E119 Type 2 diabetes mellitus without complications: Secondary | ICD-10-CM | POA: Diagnosis not present

## 2015-05-07 LAB — HM DIABETES EYE EXAM

## 2015-05-13 ENCOUNTER — Encounter: Payer: Self-pay | Admitting: Family Medicine

## 2015-05-13 LAB — HM DIABETES EYE EXAM

## 2015-06-03 ENCOUNTER — Other Ambulatory Visit: Payer: Self-pay | Admitting: Family Medicine

## 2015-06-04 MED ORDER — LISINOPRIL-HYDROCHLOROTHIAZIDE 20-12.5 MG PO TABS: 1.0000 | ORAL_TABLET | Freq: Every day | ORAL | Status: DC

## 2015-06-04 MED ORDER — GLIPIZIDE 5 MG PO TABS: 5.0000 mg | ORAL_TABLET | Freq: Every day | ORAL | Status: DC

## 2015-06-04 NOTE — Telephone Encounter (Signed)
Patient has an appt on 5/47/17 and will need a refill before then. Journey Castonguay,CMA

## 2015-06-10 ENCOUNTER — Other Ambulatory Visit: Payer: Self-pay | Admitting: *Deleted

## 2015-06-11 MED ORDER — FLUOXETINE HCL 20 MG PO TABS: 20.0000 mg | ORAL_TABLET | Freq: Every day | ORAL | Status: DC

## 2015-06-24 ENCOUNTER — Encounter: Payer: Self-pay | Admitting: Family Medicine

## 2015-06-24 ENCOUNTER — Ambulatory Visit (INDEPENDENT_AMBULATORY_CARE_PROVIDER_SITE_OTHER): Payer: BLUE CROSS/BLUE SHIELD | Admitting: Family Medicine

## 2015-06-24 VITALS — BP 162/80 | HR 96 | Temp 98.4°F | Ht 65.0 in | Wt 227.0 lb

## 2015-06-24 DIAGNOSIS — E119 Type 2 diabetes mellitus without complications: Secondary | ICD-10-CM

## 2015-06-24 DIAGNOSIS — N943 Premenstrual tension syndrome: Secondary | ICD-10-CM | POA: Diagnosis not present

## 2015-06-24 DIAGNOSIS — I10 Essential (primary) hypertension: Secondary | ICD-10-CM | POA: Diagnosis not present

## 2015-06-24 LAB — HEMOGLOBIN A1C
HEMOGLOBIN A1C: 7.6 % — AB (ref <5.7)
MEAN PLASMA GLUCOSE: 171 mg/dL

## 2015-06-24 MED ORDER — HYDROCHLOROTHIAZIDE 12.5 MG PO CAPS: 12.5000 mg | ORAL_CAPSULE | Freq: Every day | ORAL | Status: DC

## 2015-06-24 NOTE — Assessment & Plan Note (Signed)
BP Readings from Last 3 Encounters:  06/24/15 162/80  04/23/15 147/83  03/23/15 140/83   Not at goal.  Will add hctz an extra 12.5 mg and if works change to increased dose of combo lisinopril/hctz

## 2015-06-24 NOTE — Patient Instructions (Signed)
Good to see you today!  Thanks for coming in.  I will call you about the A1c and make plans  Monitor your blood pressure aim is to have < 140/90  Come back in 3 months

## 2015-06-24 NOTE — Assessment & Plan Note (Signed)
And Anxiety.  Stable continue current dose

## 2015-06-24 NOTE — Progress Notes (Signed)
   Subjective:    Patient ID: Elaine Brown, female    DOB: 02/14/1970, 45 y.o.   MRN: [id number]  HPI  HYPERTENSION Disease Monitoring: Blood pressure range-not checking Chest pain, palpitations- no      Dyspnea- no Medications: Compliance- taking regularly Lightheadedness,Syncope- no   Edema- trace  DIABETES Disease Monitoring: Blood Sugar ranges-fasting 140-180s Polyuria/phagia/dipsia- no      Visual problems- no Medications: Compliance- daily as prescribed Hypoglycemic symptoms- no  Anxiety Well controlled.  Takes prozac daily.  No sleep or appetite problems.  Is not exercising regularly but may start with her daughter    Monitoring Labs and Parameters Last A1C:  Lab Results  Component Value Date   HGBA1C 9.2 03/23/2015    Last Lipid:     Component Value Date/Time   CHOL 170 03/23/2015 0913   HDL 42* 03/23/2015 0913    Last Bmet  POTASSIUM  Date Value Ref Range Status  03/23/2015 4.6 3.5 - 5.3 mmol/L Final   SODIUM  Date Value Ref Range Status  03/23/2015 134* 135 - 146 mmol/L Final   CREAT  Date Value Ref Range Status  03/23/2015 0.65 0.50 - 1.10 mg/dL Final   CREATININE, SER  Date Value Ref Range Status  05/07/2009 0.60 0.40-1.20 mg/dL Final      Last BPs:  BP Readings from Last 3 Encounters:  06/24/15 162/80  04/23/15 147/83  03/23/15 140/83    Chief Complaint noted Review of Symptoms - see HPI PMH - Smoking status noted.   Vital Signs reviewed    Review of Systems     Objective:   Physical Exam  Alert nad Heart - Regular rate and rhythm.  No murmurs, gallops or rubs.    Lungs:  Normal respiratory effort, chest expands symmetrically. Lungs are clear to auscultation, no crackles or wheezes. Extremities:  No cyanosis, edema, or deformity noted with good range of motion of all major joints.        Assessment & Plan:

## 2015-06-24 NOTE — Assessment & Plan Note (Signed)
Unsure of control.  No A1c today.  Will send off.

## 2015-06-25 ENCOUNTER — Encounter: Payer: Self-pay | Admitting: Family Medicine

## 2015-09-02 ENCOUNTER — Encounter: Payer: Self-pay | Admitting: Internal Medicine

## 2015-09-02 ENCOUNTER — Ambulatory Visit (INDEPENDENT_AMBULATORY_CARE_PROVIDER_SITE_OTHER): Payer: BLUE CROSS/BLUE SHIELD | Admitting: Internal Medicine

## 2015-09-02 DIAGNOSIS — H00011 Hordeolum externum right upper eyelid: Secondary | ICD-10-CM | POA: Diagnosis not present

## 2015-09-02 NOTE — Patient Instructions (Signed)
Warm compresses, placed off and on for about 15 minutes at a time approximately four times per day.

## 2015-09-02 NOTE — Progress Notes (Signed)
   [facility] Noralee Chars, MD Phone: [phone]  Reason For Visit: Lesion on right eyelid   # 3 day hx of painful lesion on external right upper eyelid. Slightly irritating, not to painful. States slightly itchy at times as well. Son has previously had these. Has not increased in size. Has been using hot compress 2-4 times a day and using the tea bags, which has helped some. No fevers or chills, nausea or vomiting. No issues with visual acuity   Past Medical History Reviewed problem list.  Medications- reviewed and updated No additions to family history Social history- patient is a non smoker  Objective: BP 126/70   Pulse (!) 108   Temp 99.6 F (37.6 C) (Oral)   Ht 5\' 4"  (1.626 m)   Wt 226 lb 9.6 oz (102.8 kg)   LMP 08/12/2015   BMI 38.90 kg/m  Gen: NAD, alert, cooperative with exam HEENT: NCAT, EOMI, PERRL, slightly painful to the touch, no surrounding erythema. Under part of the eyelid, noted a small pustular lesion likely a swollen gland  Neck: FROM, supple   Visual Acuity testing obtained- normal    Assessment/Plan: See problem based a/p  Hordeolum externum of right upper eyelid Continue warm compresses on the eye for 15 minutes, four times daily  Return precautions - visual problems, worsening erythema or pain

## 2015-09-03 ENCOUNTER — Encounter: Payer: Self-pay | Admitting: Internal Medicine

## 2015-09-03 NOTE — Assessment & Plan Note (Signed)
Continue warm compresses on the eye for 15 minutes, four times daily  Return precautions - visual problems, worsening erythema or pain

## 2015-09-16 ENCOUNTER — Other Ambulatory Visit: Payer: Self-pay | Admitting: *Deleted

## 2015-09-17 MED ORDER — LISINOPRIL-HYDROCHLOROTHIAZIDE 20-12.5 MG PO TABS: 1.0000 | ORAL_TABLET | Freq: Every day | ORAL | 1 refills | Status: DC

## 2015-09-17 MED ORDER — GLIPIZIDE 5 MG PO TABS: 5.0000 mg | ORAL_TABLET | Freq: Every day | ORAL | 1 refills | Status: DC

## 2015-10-19 ENCOUNTER — Other Ambulatory Visit: Payer: Self-pay | Admitting: *Deleted

## 2015-10-20 MED ORDER — HYDROCHLOROTHIAZIDE 12.5 MG PO CAPS: 12.5000 mg | ORAL_CAPSULE | Freq: Every day | ORAL | 2 refills | Status: DC

## 2015-11-04 ENCOUNTER — Ambulatory Visit (INDEPENDENT_AMBULATORY_CARE_PROVIDER_SITE_OTHER): Payer: BLUE CROSS/BLUE SHIELD | Admitting: Family Medicine

## 2015-11-04 ENCOUNTER — Encounter: Payer: Self-pay | Admitting: Family Medicine

## 2015-11-04 VITALS — BP 125/67 | HR 97 | Temp 98.8°F | Wt 228.0 lb

## 2015-11-04 DIAGNOSIS — N943 Premenstrual tension syndrome: Secondary | ICD-10-CM

## 2015-11-04 DIAGNOSIS — I10 Essential (primary) hypertension: Secondary | ICD-10-CM

## 2015-11-04 DIAGNOSIS — E119 Type 2 diabetes mellitus without complications: Secondary | ICD-10-CM

## 2015-11-04 LAB — POCT GLYCOSYLATED HEMOGLOBIN (HGB A1C): HEMOGLOBIN A1C: 7.2

## 2015-11-04 MED ORDER — LISINOPRIL-HYDROCHLOROTHIAZIDE 20-25 MG PO TABS: 1.0000 | ORAL_TABLET | Freq: Every day | ORAL | 3 refills | Status: DC

## 2015-11-04 NOTE — Assessment & Plan Note (Signed)
Stable on prozac.  

## 2015-11-04 NOTE — Patient Instructions (Addendum)
Good to see you today!  Thanks for coming in.  Your blood pressure and diabetes are doing great  Continue to work on your weight  Come back in 3 months

## 2015-11-04 NOTE — Assessment & Plan Note (Signed)
Good control

## 2015-11-04 NOTE — Assessment & Plan Note (Signed)
Improved and at goal

## 2015-11-04 NOTE — Progress Notes (Signed)
Subjective  Patient is presenting with the following illnesses  HYPERTENSION Disease Monitoring: Blood pressure range-usually controlled at home Chest pain, palpitations- no      Dyspnea- no Medications: Compliance- tolerating extra HCTZ well Lightheadedness,Syncope- no   Edema- no  DIABETES Disease Monitoring: Blood Sugar ranges-not checking Polyuria/phagia/dipsia- no      Visual problems- no Medications: Compliance- daily Hypoglycemic symptoms- no  Anxiety Taking prozac daily.  If does not feels worse.  Stressors are her sick pastor husband and special needs child.  Her sleep and appetite are good.  She does scratch pick at her skin on arms and legs.  She feels this is stable   Monitoring Labs and Parameters Last A1C:  Lab Results  Component Value Date   HGBA1C 7.2 11/04/2015    Last Lipid:     Component Value Date/Time   CHOL 170 03/23/2015 0913   HDL 42 (L) 03/23/2015 0913    Last Bmet  Potassium  Date Value Ref Range Status  03/23/2015 4.6 3.5 - 5.3 mmol/L Final   Sodium  Date Value Ref Range Status  03/23/2015 134 (L) 135 - 146 mmol/L Final   Creat  Date Value Ref Range Status  03/23/2015 0.65 0.50 - 1.10 mg/dL Final      Last BPs:  BP Readings from Last 3 Encounters:  11/04/15 125/67  09/02/15 126/70  06/24/15 (!) 162/80    Chief Complaint noted Review of Symptoms - see HPI PMH - Smoking status noted.     Objective Vital Signs reviewed  R knee - FROM mild crepitus mild effusion no redness    Assessments/Plans  No problem-specific Assessment & Plan notes found for this encounter.   See Encounter view if individual problem A/Ps not visible See after visit summary for details of patient instuctions

## 2015-12-01 ENCOUNTER — Ambulatory Visit (INDEPENDENT_AMBULATORY_CARE_PROVIDER_SITE_OTHER): Payer: BLUE CROSS/BLUE SHIELD | Admitting: Family Medicine

## 2015-12-01 ENCOUNTER — Encounter: Payer: Self-pay | Admitting: Family Medicine

## 2015-12-01 DIAGNOSIS — H8110 Benign paroxysmal vertigo, unspecified ear: Secondary | ICD-10-CM | POA: Insufficient documentation

## 2015-12-01 MED ORDER — MECLIZINE HCL 25 MG PO TABS: 25.0000 mg | ORAL_TABLET | Freq: Three times a day (TID) | ORAL | 0 refills | Status: DC | PRN

## 2015-12-01 MED ORDER — ONDANSETRON HCL 4 MG PO TABS: 4.0000 mg | ORAL_TABLET | Freq: Three times a day (TID) | ORAL | 0 refills | Status: DC | PRN

## 2015-12-01 NOTE — Patient Instructions (Signed)

## 2015-12-01 NOTE — Assessment & Plan Note (Signed)
History consistent with BPPV Patient unwilling to perform Epley's maneuver No signs of stroke or TIA Meclizine when necessary return precautions including signs of stroke discussed with patient and husband

## 2015-12-01 NOTE — Progress Notes (Signed)
   Subjective:   Elaine Brown is a 45 y.o. female with a history of T2 DM, HTN, HLD, obesity here for same day appointment for dizziness and nausea  Patient reports that her symptoms started 5 days ago after starting to use lavender essential oils. It started with dizziness that seemed to get better but then recurred this morning after using a new essential oil. The dizziness is also associated with frontal headaches that are similar to previous headaches and nausea and vomiting 1 this morning. She reports the dizziness is worsened by movement. Denies any numbness, weakness, slurred speech, fevers, URI symptoms. She has no known sick contacts.  Review of Systems:  Per HPI.   Social History: never smoker  Objective:  BP (!) 160/80   Pulse 94   Temp 97.6 F (36.4 C) (Oral)   Ht 5\' 4"  (1.626 m)   Wt 226 lb 3.2 oz (102.6 kg)   LMP 10/31/2015   BMI 38.83 kg/m   Gen:  45 y.o. female in NAD HEENT: NCAT, MMM, EOMI, PERRL, anicteric sclerae, OP clear, TMs clear b/l, unable to do Epley's maneuver due to patient willingness CV: RRR, no MRG Resp: Non-labored, CTAB, no wheezes noted Abd: Soft, NTND, BS present, no guarding or organomegaly Ext: WWP, no edema MSK: strength intact in UEs and LEs Neuro: Alert and oriented, speech normal      Chemistry      Component Value Date/Time   NA 134 (L) 03/23/2015 0913   K 4.6 03/23/2015 0913   CL 98 03/23/2015 0913   CO2 25 03/23/2015 0913   BUN 12 03/23/2015 0913   CREATININE 0.65 03/23/2015 0913      Component Value Date/Time   CALCIUM 9.0 03/23/2015 0913   ALKPHOS 72 03/23/2015 0913   AST 33 (H) 03/23/2015 0913   ALT 30 (H) 03/23/2015 0913   BILITOT 0.5 03/23/2015 0913      Lab Results  Component Value Date   WBC 6.5 03/23/2015   HGB 12.3 03/23/2015   HCT 39.0 03/23/2015   MCV 81.4 03/23/2015   PLT 346 03/23/2015   No results found for: TSH Lab Results  Component Value Date   HGBA1C 7.2 11/04/2015   Assessment & Plan:       Elaine HearingJanie L Brown is a 45 y.o. female here for   BPPV (benign paroxysmal positional vertigo) History consistent with BPPV Patient unwilling to perform Epley's maneuver No signs of stroke or TIA Meclizine when necessary return precautions including signs of stroke discussed with patient and husband  BP elevated after patient vomited antihypertensives this morning Advised follow-up with PCP   Erasmo DownerAngela M Gunnar Hereford, MD MPH PGY-3,  [facility] 12/01/2015  11:51 AM

## 2015-12-22 ENCOUNTER — Ambulatory Visit: Payer: No Typology Code available for payment source | Admitting: Podiatry

## 2016-01-04 ENCOUNTER — Other Ambulatory Visit: Payer: Self-pay | Admitting: *Deleted

## 2016-01-04 MED ORDER — METFORMIN HCL 1000 MG PO TABS: ORAL_TABLET | ORAL | 3 refills | Status: DC

## 2016-01-04 MED ORDER — FLUOXETINE HCL 20 MG PO TABS: 20.0000 mg | ORAL_TABLET | Freq: Every day | ORAL | 2 refills | Status: DC

## 2016-02-25 NOTE — Progress Notes (Addendum)
   Subjective:    Patient ID: Elaine Brown , female   DOB: 05/23/1970 , 46 y.o..   MRN: [id number]  HPI  Elaine HearingJanie L Douthitt is here for  Chief Complaint  Patient presents with  . Nasal Congestion   URI  Has been sick for 14 days With a runny nose. She notes that the runny nose is persistent with clear rhinorrhea. This past week she has felt like the right ear is full. She denies any pain from the ear. She denies any ear discharge. Nasal discharge: yes, clear bilaterally Medications tried: Has tried Sudafed without relief Sick contacts: None  Symptoms Fever: No Headache or face pain: No Tooth pain: No Sneezing: Occasionally Scratchy throat: No Allergies: No Muscle aches: No Severe fatigue: No Stiff neck: No Shortness of breath: No Rash: Yes, patient noted that 2 weeks ago she developed a itchy rash on the left side of her neck. She notes that this rash has gotten bigger. She has not tried any creams on it. Sore throat or swollen glands: No   ROS see HPI Smoking Status noted  Past Medical Medications: reviewed   Social Hx:  reports that she has never smoked. She has never used smokeless tobacco.   Objective:   BP 128/76   Pulse 96   Temp 98.1 F (36.7 C) (Oral)   Ht 5\' 4"  (1.626 m)   Wt 224 lb 3.2 oz (101.7 kg)   LMP 02/17/2016   SpO2 98%   BMI 38.48 kg/m  Physical Exam  Gen: NAD, alert, cooperative with exam, well-appearing HEENT:     Head: Normocephalic, atraumatic    Neck: No masses palpated. No goiter. No lymphadenopathy     Ears: External ears normal, no drainage.Tympanic membrane on left unable to be visualized due to cerumen impaction, tympanic membrane on right showing some fluid behind the membrane without significant erythema but there is slight bulging. Tympanometry showing left shifted peak    Eyes: PERRLA, EOMI, sclera white, normal conjunctiva    Nose: nasal turbinates moist, clear nasal discharge bilaterally    Throat: moist mucus membranes, unable  to visualize most of pharynx due to anatomy. Airway is patent Cardiac: Regular rate and rhythm, normal S1/S2, no murmur, no edema, capillary refill brisk  Respiratory: Clear to auscultation bilaterally, no wheezes, non-labored breathing Skin: Erythematous dry annular rash over her left neck (see picture below) Psych: good insight, normal mood and affect      Assessment & Plan:   Nasal congestion: Resolving upper respiratory infection. Patient has lingering runny nose and nasal congestion. Continue symptomatic care. We'll begin Flonase as well, prescription sent to patient's pharmacy.  Acute otitis media with effusion of right ear: Discussed that there is fullness in her ear likely due to the nasal congestion following her URI. There is fluid behind the ear on exam. Sent prescription for amoxicillin 500 mg twice a day for 5 day course.  Rash: Likely ringworm rash of left neck. Sent prescription for Lotrimin cream to patient's pharmacy. Discussed that this rash may also be eczema and that if it does not improve after week of treatment please come back to the clinic. Discussed that this rash is very contagious and she will need to wash all clothing items that have been in contact with that.   Anders Simmondshristina Abhiram Criado, MD [facility], PGY-2

## 2016-02-26 ENCOUNTER — Ambulatory Visit (INDEPENDENT_AMBULATORY_CARE_PROVIDER_SITE_OTHER): Payer: BLUE CROSS/BLUE SHIELD | Admitting: Family Medicine

## 2016-02-26 VITALS — BP 128/76 | HR 96 | Temp 98.1°F | Ht 64.0 in | Wt 224.2 lb

## 2016-02-26 DIAGNOSIS — J069 Acute upper respiratory infection, unspecified: Secondary | ICD-10-CM | POA: Diagnosis not present

## 2016-02-26 DIAGNOSIS — E119 Type 2 diabetes mellitus without complications: Secondary | ICD-10-CM

## 2016-02-26 DIAGNOSIS — R21 Rash and other nonspecific skin eruption: Secondary | ICD-10-CM

## 2016-02-26 DIAGNOSIS — H6690 Otitis media, unspecified, unspecified ear: Secondary | ICD-10-CM | POA: Diagnosis not present

## 2016-02-26 MED ORDER — AMOXICILLIN 500 MG PO CAPS: 500.0000 mg | ORAL_CAPSULE | Freq: Two times a day (BID) | ORAL | 0 refills | Status: AC

## 2016-02-26 MED ORDER — CLOTRIMAZOLE 1 % EX CREA: 1.0000 "application " | TOPICAL_CREAM | Freq: Two times a day (BID) | CUTANEOUS | 0 refills | Status: DC

## 2016-02-26 MED ORDER — FLUTICASONE PROPIONATE 50 MCG/ACT NA SUSP: 1.0000 | Freq: Every day | NASAL | 2 refills | Status: DC

## 2016-02-26 NOTE — Patient Instructions (Signed)
Thank you for coming in today, it was so nice to see you! Today we talked about:    Ear infection: You have an infection of your right ear, we have sent a prescription to your pharmacy. Please take the amoxicillin antibiotic as prescribed.  Runny nose: I suggested he use Flonase nose spray. Use 1 spray in each nostril 2-3 times a day until your symptoms resolve  Rash: This is likely ringworm. I have sent a prescription cream to her pharmacy. If the rash does not resolve in a week please come back as this may also be eczema.   If you have any questions or concerns, please do not hesitate to call the office at [phone]. You can also message me directly via MyChart.   Sincerely,  Anders Simmondshristina Demarkis Gheen, MD       Upper Respiratory Infection, Adult Most upper respiratory infections (URIs) are caused by a virus. A URI affects the nose, throat, and upper air passages. The most common type of URI is often called "the common cold." Follow these instructions at home:  Take medicines only as told by your doctor.  Gargle warm saltwater or take cough drops to comfort your throat as told by your doctor.  Use a warm mist humidifier or inhale steam from a shower to increase air moisture. This may make it easier to breathe.  Drink enough fluid to keep your pee (urine) clear or pale yellow.  Eat soups and other clear broths.  Have a healthy diet.  Rest as needed.  Go back to work when your fever is gone or your doctor says it is okay.  You may need to stay home longer to avoid giving your URI to others.  You can also wear a face mask and wash your hands often to prevent spread of the virus.  Use your inhaler more if you have asthma.  Do not use any tobacco products, including cigarettes, chewing tobacco, or electronic cigarettes. If you need help quitting, ask your doctor. Contact a doctor if:  You are getting worse, not better.  Your symptoms are not helped by medicine.  You  have chills.  You are getting more short of breath.  You have brown or red mucus.  You have yellow or brown discharge from your nose.  You have pain in your face, especially when you bend forward.  You have a fever.  You have puffy (swollen) neck glands.  You have pain while swallowing.  You have white areas in the back of your throat. Get help right away if:  You have very bad or constant:  Headache.  Ear pain.  Pain in your forehead, behind your eyes, and over your cheekbones (sinus pain).  Chest pain.  You have long-lasting (chronic) lung disease and any of the following:  Wheezing.  Long-lasting cough.  Coughing up blood.  A change in your usual mucus.  You have a stiff neck.  You have changes in your:  Vision.  Hearing.  Thinking.  Mood. This information is not intended to replace advice given to you by your health care provider. Make sure you discuss any questions you have with your health care provider. Document Released: 07/06/2007 Document Revised: 09/20/2015 Document Reviewed: 04/24/2013 Elsevier Interactive Patient Education  2017 [employer]

## 2016-03-07 ENCOUNTER — Other Ambulatory Visit: Payer: Self-pay | Admitting: Family Medicine

## 2016-03-24 ENCOUNTER — Encounter: Payer: Self-pay | Admitting: Podiatry

## 2016-03-24 ENCOUNTER — Ambulatory Visit (INDEPENDENT_AMBULATORY_CARE_PROVIDER_SITE_OTHER): Payer: BLUE CROSS/BLUE SHIELD | Admitting: Podiatry

## 2016-03-24 VITALS — BP 184/95 | HR 112 | Resp 18

## 2016-03-24 DIAGNOSIS — L603 Nail dystrophy: Secondary | ICD-10-CM | POA: Diagnosis not present

## 2016-03-24 NOTE — Progress Notes (Signed)
Elaine Brown presents today with a chief complaint of toenails and second digits laterally that appeared to be curling under. She has a history of diabetes which is not well-controlled but is requesting to have the toenails removed.  Objective: Vital signs are stable she is alert and oriented 3. Pulses are barely palpable bilateral. Neurologic sensorium is intact deep tendon reflexes are intact muscle strength normal. Mild edema about the bilateral lower extremity. Toenails second digit bilateral knee demonstrate severe nail dystrophy with curling and thickening of the nails which are painful. These are painful today on evaluation and debridement.  Assessment: Diabetes mellitus with nail dystrophy and onychomycosis.  Plan: I debrided the nails bilaterally today 1 through 5 I did not remove the nails to the second toe because she has no idea what her an order for me to is. She does not perform daily checks with her blood sugar. So I recommended that she follow-up with her primary care provider to ascertain as to whether or not she could have this type of procedure done.. I will follow-up with her on an as-needed basis.

## 2016-03-30 ENCOUNTER — Encounter: Payer: Self-pay | Admitting: Family Medicine

## 2016-03-30 ENCOUNTER — Ambulatory Visit (INDEPENDENT_AMBULATORY_CARE_PROVIDER_SITE_OTHER): Payer: BLUE CROSS/BLUE SHIELD | Admitting: Family Medicine

## 2016-03-30 VITALS — BP 128/70 | HR 97 | Temp 98.6°F | Ht 64.0 in | Wt 225.0 lb

## 2016-03-30 DIAGNOSIS — I10 Essential (primary) hypertension: Secondary | ICD-10-CM | POA: Diagnosis not present

## 2016-03-30 DIAGNOSIS — E785 Hyperlipidemia, unspecified: Secondary | ICD-10-CM

## 2016-03-30 DIAGNOSIS — E119 Type 2 diabetes mellitus without complications: Secondary | ICD-10-CM

## 2016-03-30 DIAGNOSIS — Z114 Encounter for screening for human immunodeficiency virus [HIV]: Secondary | ICD-10-CM

## 2016-03-30 LAB — COMPLETE METABOLIC PANEL WITH GFR
ALBUMIN: 4 g/dL (ref 3.6–5.1)
ALK PHOS: 54 U/L (ref 33–115)
ALT: 19 U/L (ref 6–29)
AST: 18 U/L (ref 10–35)
BILIRUBIN TOTAL: 0.3 mg/dL (ref 0.2–1.2)
BUN: 11 mg/dL (ref 7–25)
CO2: 21 mmol/L (ref 20–31)
CREATININE: 0.64 mg/dL (ref 0.50–1.10)
Calcium: 9.1 mg/dL (ref 8.6–10.2)
Chloride: 101 mmol/L (ref 98–110)
GFR, Est African American: >89 mL/min (ref >=60)
GFR, Est Non African American: >89 mL/min (ref >=60)
GLUCOSE: 128 mg/dL — AB (ref 65–99)
Potassium: 4.3 mmol/L (ref 3.5–5.3)
SODIUM: 136 mmol/L (ref 135–146)
TOTAL PROTEIN: 6.9 g/dL (ref 6.1–8.1)

## 2016-03-30 LAB — LIPID PANEL
Cholesterol: 166 mg/dL (ref <200)
HDL: 43 mg/dL — ABNORMAL LOW (ref >50)
LDL CALC: 104 mg/dL — AB (ref <100)
Total CHOL/HDL Ratio: 3.9 Ratio (ref <5.0)
Triglycerides: 96 mg/dL (ref <150)
VLDL: 19 mg/dL (ref <30)

## 2016-03-30 LAB — POCT GLYCOSYLATED HEMOGLOBIN (HGB A1C): Hemoglobin A1C: 6.8

## 2016-03-30 NOTE — Assessment & Plan Note (Signed)
Working on her diet but not able to achieve weight loss.   Check labs

## 2016-03-30 NOTE — Assessment & Plan Note (Signed)
Good control.  Stop glipizide and follow

## 2016-03-30 NOTE — Patient Instructions (Addendum)
Good to see you today!  Thanks for coming in.  Your diabetes is doing great.  I would stop your glipizide  Check your blood pressure when you can if regularly > 140/90 then call me  I will call you if your tests are not good.  Otherwise I will send you a letter.  If you do not hear from me with in 2 weeks please call our office.     Come back in 3 months

## 2016-03-30 NOTE — Assessment & Plan Note (Signed)
BP Readings from Last 3 Encounters:  03/30/16 128/70  03/24/16 (!) 184/95  02/26/16 128/76   Usually under good control except when under stress continue current medication checks labs

## 2016-03-30 NOTE — Progress Notes (Signed)
Subjective  Patient is presenting with the following illnesses  HYPERTENSION Disease Monitoring: Blood pressure range-not checking does have amachine though Chest pain, palpitations- no      Dyspnea- no Medications: Compliance- daily Lightheadedness,Syncope- no   Edema- no  DIABETES Disease Monitoring: Blood Sugar ranges-usually < 150 Polyuria/phagia/dipsia- no      Visual problems- no Medications: Compliance- daily Hypoglycemic symptoms- no  HYPERLIPIDEMIA Disease Monitoring: See symptoms for Hypertension Medications: Compliance- watching diet but no weight loss Right upper quadrant pain- no  Muscle aches- no  Monitoring Labs and Parameters Last A1C:  Lab Results  Component Value Date   HGBA1C 6.8 03/30/2016    Last Lipid:     Component Value Date/Time   CHOL 170 03/23/2015 0913   HDL 42 (L) 03/23/2015 0913    Last Bmet  Potassium  Date Value Ref Range Status  03/23/2015 4.6 3.5 - 5.3 mmol/L Final   Sodium  Date Value Ref Range Status  03/23/2015 134 (L) 135 - 146 mmol/L Final   Creat  Date Value Ref Range Status  03/23/2015 0.65 0.50 - 1.10 mg/dL Final      Last BPs:  BP Readings from Last 3 Encounters:  03/30/16 (!) 142/84  03/24/16 (!) 184/95  02/26/16 128/76        Chief Complaint noted Review of Symptoms - see HPI PMH - Smoking status noted.     Objective Vital Signs reviewed  Diabetic Foot Check -  Appearance - no lesions, ulcers or calluses Skin - no unusual pallor or redness Monofilament testing -  Right - Great toe, medial, central, lateral ball and posterior foot intact Left - Great toe, medial, central, lateral ball and posterior foot intact Heart - Regular rate and rhythm.  No murmurs, gallops or rubs.    Lungs:  Normal respiratory effort, chest expands symmetrically. Lungs are clear to auscultation, no crackles or wheezes.   Assessments/Plans  No problem-specific Assessment & Plan notes found for this encounter.   See  Encounter view if individual problem A/Ps not visible See after visit summary for details of patient instuctions

## 2016-03-31 ENCOUNTER — Encounter: Payer: Self-pay | Admitting: Family Medicine

## 2016-03-31 ENCOUNTER — Other Ambulatory Visit: Payer: Self-pay | Admitting: Family Medicine

## 2016-03-31 LAB — HIV ANTIBODY (ROUTINE TESTING W REFLEX): HIV: NONREACTIVE

## 2016-03-31 MED ORDER — ATORVASTATIN CALCIUM 40 MG PO TABS: 40.0000 mg | ORAL_TABLET | Freq: Every day | ORAL | 2 refills | Status: DC

## 2016-05-12 DIAGNOSIS — E119 Type 2 diabetes mellitus without complications: Secondary | ICD-10-CM | POA: Diagnosis not present

## 2016-05-12 DIAGNOSIS — H52203 Unspecified astigmatism, bilateral: Secondary | ICD-10-CM | POA: Diagnosis not present

## 2016-05-12 LAB — HM DIABETES EYE EXAM

## 2016-05-23 ENCOUNTER — Encounter: Payer: Self-pay | Admitting: Family Medicine

## 2016-05-25 ENCOUNTER — Other Ambulatory Visit: Payer: Self-pay | Admitting: Family Medicine

## 2016-07-30 ENCOUNTER — Other Ambulatory Visit: Payer: Self-pay | Admitting: Family Medicine

## 2016-09-26 ENCOUNTER — Other Ambulatory Visit: Payer: Self-pay | Admitting: Family Medicine

## 2016-09-28 ENCOUNTER — Ambulatory Visit: Payer: BLUE CROSS/BLUE SHIELD | Admitting: Podiatry

## 2016-11-24 ENCOUNTER — Institutional Professional Consult (permissible substitution): Payer: BLUE CROSS/BLUE SHIELD | Admitting: Medical

## 2017-01-04 ENCOUNTER — Encounter: Payer: Self-pay | Admitting: Medical

## 2017-01-04 ENCOUNTER — Ambulatory Visit: Payer: BLUE CROSS/BLUE SHIELD | Admitting: Medical

## 2017-01-04 VITALS — BP 124/76 | HR 110 | Ht 63.0 in | Wt 210.4 lb

## 2017-01-04 DIAGNOSIS — Z1239 Encounter for other screening for malignant neoplasm of breast: Secondary | ICD-10-CM | POA: Insufficient documentation

## 2017-01-04 DIAGNOSIS — E669 Obesity, unspecified: Secondary | ICD-10-CM | POA: Diagnosis not present

## 2017-01-04 DIAGNOSIS — E119 Type 2 diabetes mellitus without complications: Secondary | ICD-10-CM | POA: Diagnosis not present

## 2017-01-04 DIAGNOSIS — Z1231 Encounter for screening mammogram for malignant neoplasm of breast: Secondary | ICD-10-CM

## 2017-01-04 DIAGNOSIS — Z2821 Immunization not carried out because of patient refusal: Secondary | ICD-10-CM | POA: Diagnosis not present

## 2017-01-04 DIAGNOSIS — M953 Acquired deformity of neck: Secondary | ICD-10-CM | POA: Diagnosis not present

## 2017-01-04 DIAGNOSIS — E785 Hyperlipidemia, unspecified: Secondary | ICD-10-CM | POA: Diagnosis not present

## 2017-01-04 DIAGNOSIS — R000 Tachycardia, unspecified: Secondary | ICD-10-CM

## 2017-01-04 DIAGNOSIS — I10 Essential (primary) hypertension: Secondary | ICD-10-CM

## 2017-01-04 MED ORDER — METFORMIN HCL 1000 MG PO TABS: ORAL_TABLET | ORAL | 1 refills | Status: DC

## 2017-01-04 MED ORDER — RANITIDINE HCL 300 MG PO TABS: 300.0000 mg | ORAL_TABLET | Freq: Every day | ORAL | 0 refills | Status: DC

## 2017-01-04 MED ORDER — ASPIRIN 81 MG PO TABS: 81.0000 mg | ORAL_TABLET | Freq: Every day | ORAL | 3 refills | Status: DC

## 2017-01-04 MED ORDER — LISINOPRIL-HYDROCHLOROTHIAZIDE 20-25 MG PO TABS: 1.0000 | ORAL_TABLET | Freq: Every day | ORAL | 1 refills | Status: DC

## 2017-01-04 MED ORDER — ATORVASTATIN CALCIUM 40 MG PO TABS: 40.0000 mg | ORAL_TABLET | Freq: Every day | ORAL | 0 refills | Status: DC

## 2017-01-04 MED ORDER — FLUOXETINE HCL 20 MG PO CAPS: ORAL_CAPSULE | ORAL | 0 refills | Status: DC

## 2017-01-04 NOTE — Progress Notes (Signed)
Subjective: Chief Complaint  Patient presents with  . New Patient (Initial Visit)    new pt, getting established    Here as a new patient to get established.    Wanted to try and different PCP.  Was seeing Dr. Deirdre Priesthambliss in the past.   She notes being diagnosed with diabetes in the past 4-5 years, or at least prediabetic.  This started initially when she had symptoms of PCOS in the past, and her sugars were impaired then.  Was put on metformin then, has remained on metformin since then.  Last HgbA1C 03/2016 was 6.8%.  Has had HgbA1C as high as 9% in the past.   Was on glyburide in the past but on metformin now. thinks she has been borderline diabetes the past 4-5 years.  Has a glucometer, but doesn't check sugars regularly.  Hypertension-has been on BP medication just about the same amount of time she has been prediabetic.  Has a BP instrument at home but doesn't check BP regulatory  Exercise - not much.   Doesn't sit down much though, always on the go.  She is compliant with medication for cholesterol.    No prior cardiac eval.  No recent chest pain or shortness of breath or edema  Feeling ok of late other than some low back pain in the mornings at times.  Past Medical History:  Diagnosis Date  . Diabetes mellitus without complication (HCC)   . Hyperlipidemia   . Hypertension   . Neck deformity, acquired   . Obesity   . Wears glasses    Current Outpatient Medications on File Prior to Visit  Medication Sig Dispense Refill  . B Complex-C (SUPER B COMPLEX PO) Take 1 tablet by mouth daily. Reported on 03/23/2015    . calcium carbonate (TUMS - DOSED IN MG ELEMENTAL CALCIUM) 500 MG chewable tablet Chew 1 tablet by mouth daily.    . Calcium Carbonate-Vitamin D (CALTRATE 600+D) 600-400 MG-UNIT per chew tablet Chew 1 tablet by mouth daily. 90 tablet 3  . Multiple Vitamins-Minerals (CENTRUM) tablet Take 1 tablet by mouth daily.      . Zinc 50 MG TABS Take 1 tablet by mouth daily.       No  current facility-administered medications on file prior to visit.    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  . Essential hypertension Yes  . Diabetes mellitus without complication   . Hyperlipidemia, unspecified hyperlipidemia type   . Obesity with serious comorbidity, unspecified classification, unspecified obesity type   . Tachycardia   . Screening for breast cancer   .  Neck deformity, acquired   . Influenza vaccination declined     Plan: I reviewed labs in the chart from 03/2016.  We discussed her medical history, medications  I recommend she continue efforts to lose weight eat healthy diet and try to exercise   Continue current medications.  Advised to start checking sugars every few days fasting to ensure they are staying stable  She will call and schedule for first screening mammogram  She declines flu shot today  I advised that she return in 2-3 months for fasting physical   Elaine Brown was seen today for new patient (initial visit).  Diagnoses and all orders for this visit:  Essential hypertension  Diabetes mellitus without complication  Hyperlipidemia, unspecified  hyperlipidemia type  Obesity with serious comorbidity, unspecified classification, unspecified obesity type  Tachycardia  Screening for breast cancer -     MM DIGITAL SCREENING BILATERAL; Future  Neck deformity, acquired  Influenza vaccination declined  Other orders -     aspirin 81 MG tablet; Take 1 tablet (81 mg total) by mouth daily. -     atorvastatin (LIPITOR) 40 MG tablet; Take 1 tablet (40 mg total) by mouth daily. -     ranitidine (ZANTAC) 300 MG tablet; Take 1 tablet (300 mg total) by mouth at bedtime. -     metFORMIN (GLUCOPHAGE) 1000 MG tablet; 1.5 tabs in AM and 1 in PM -     FLUoxetine (PROZAC) 20 MG capsule; Take 1 capsule (20 mg total) by mouth daily. -     lisinopril-hydrochlorothiazide (PRINZIDE,ZESTORETIC) 20-25 MG tablet; Take 1 tablet by mouth daily.

## 2017-02-10 ENCOUNTER — Encounter: Payer: BLUE CROSS/BLUE SHIELD | Admitting: Medical

## 2017-03-02 ENCOUNTER — Ambulatory Visit: Payer: BLUE CROSS/BLUE SHIELD | Admitting: Medical

## 2017-03-02 ENCOUNTER — Encounter: Payer: Self-pay | Admitting: Medical

## 2017-03-02 VITALS — BP 138/80 | HR 94 | Temp 98.2°F | Ht 63.0 in | Wt 216.4 lb

## 2017-03-02 DIAGNOSIS — M953 Acquired deformity of neck: Secondary | ICD-10-CM

## 2017-03-02 DIAGNOSIS — Z7185 Encounter for immunization safety counseling: Secondary | ICD-10-CM

## 2017-03-02 DIAGNOSIS — Z1231 Encounter for screening mammogram for malignant neoplasm of breast: Secondary | ICD-10-CM

## 2017-03-02 DIAGNOSIS — Z0000 Encounter for general adult medical examination without abnormal findings: Secondary | ICD-10-CM | POA: Diagnosis not present

## 2017-03-02 DIAGNOSIS — E119 Type 2 diabetes mellitus without complications: Secondary | ICD-10-CM

## 2017-03-02 DIAGNOSIS — Z129 Encounter for screening for malignant neoplasm, site unspecified: Secondary | ICD-10-CM | POA: Insufficient documentation

## 2017-03-02 DIAGNOSIS — Z9851 Tubal ligation status: Secondary | ICD-10-CM | POA: Diagnosis not present

## 2017-03-02 DIAGNOSIS — Z7189 Other specified counseling: Secondary | ICD-10-CM | POA: Diagnosis not present

## 2017-03-02 DIAGNOSIS — I10 Essential (primary) hypertension: Secondary | ICD-10-CM

## 2017-03-02 DIAGNOSIS — Z2821 Immunization not carried out because of patient refusal: Secondary | ICD-10-CM

## 2017-03-02 DIAGNOSIS — Z124 Encounter for screening for malignant neoplasm of cervix: Secondary | ICD-10-CM | POA: Diagnosis not present

## 2017-03-02 DIAGNOSIS — E785 Hyperlipidemia, unspecified: Secondary | ICD-10-CM

## 2017-03-02 DIAGNOSIS — E669 Obesity, unspecified: Secondary | ICD-10-CM

## 2017-03-02 DIAGNOSIS — F424 Excoriation (skin-picking) disorder: Secondary | ICD-10-CM

## 2017-03-02 DIAGNOSIS — J069 Acute upper respiratory infection, unspecified: Secondary | ICD-10-CM

## 2017-03-02 DIAGNOSIS — Z1239 Encounter for other screening for malignant neoplasm of breast: Secondary | ICD-10-CM

## 2017-03-02 MED ORDER — HYDROXYZINE HCL 10 MG PO TABS: 10.0000 mg | ORAL_TABLET | Freq: Three times a day (TID) | ORAL | 2 refills | Status: AC | PRN

## 2017-03-02 NOTE — Patient Instructions (Addendum)
Specific Recommendations:  See your eye doctor yearly for routine vision care.  See your dentist yearly for routine dental care including hygiene visits twice yearly.  We will call with lab results  Call and schedule your mammogram  Return at your convenience for the Pap smear  Begin trial of hydroxyzine tablet 2-3 times a day to help with itching and scratching habit  Vaccines:  I recommend you have a yearly influenza vaccine  I recommend you have a one-time pneumonia vaccine prior to age 42  You will be due for a tetanus booster in 2 years    Health Maintenance, Female Adopting a healthy lifestyle and getting preventive care can go a long way to promote health and wellness. Talk with your health care provider about what schedule of regular examinations is right for you. This is a good chance for you to check in with your provider about disease prevention and staying healthy. In between checkups, there are plenty of things you can do on your own. Experts have done a lot of research about which lifestyle changes and preventive measures are most likely to keep you healthy. Ask your health care provider for more information. Weight and diet Eat a healthy diet  Be sure to include plenty of vegetables, fruits, low-fat dairy products, and lean protein.  Do not eat a lot of foods high in solid fats, added sugars, or salt.  Get regular exercise. This is one of the most important things you can do for your health. ? Most adults should exercise for at least 150 minutes each week. The exercise should increase your heart rate and make you sweat (moderate-intensity exercise). ? Most adults should also do strengthening exercises at least twice a week. This is in addition to the moderate-intensity exercise.  Maintain a healthy weight  Body mass index (BMI) is a measurement that can be used to identify possible weight problems. It estimates body fat based on height and weight. Your health  care provider can help determine your BMI and help you achieve or maintain a healthy weight.  For females 81 years of age and older: ? A BMI below 18.5 is considered underweight. ? A BMI of 18.5 to 24.9 is normal. ? A BMI of 25 to 29.9 is considered overweight. ? A BMI of 30 and above is considered obese.  Watch levels of cholesterol and blood lipids  You should start having your blood tested for lipids and cholesterol at 47 years of age, then have this test every 5 years.  You may need to have your cholesterol levels checked more often if: ? Your lipid or cholesterol levels are high. ? You are older than 47 years of age. ? You are at high risk for heart disease.   Breast Cancer  Practice breast self-awareness. This means understanding how your breasts normally appear and feel.  It also means doing regular breast self-exams. Let your health care provider know about any changes, no matter how small.  If you are in your 20s or 30s, you should have a clinical breast exam (CBE) by a health care provider every 1-3 years as part of a regular health exam.  If you are 58 or older, have a CBE every year. Also consider having a breast X-ray (mammogram) every year.  If you have a family history of breast cancer, talk to your health care provider about genetic screening.  If you are at high risk for breast cancer, talk to your health care provider about  having an MRI and a mammogram every year.  Breast cancer gene (BRCA) assessment is recommended for women who have family members with BRCA-related cancers. BRCA-related cancers include: ? Breast. ? Ovarian. ? Tubal. ? Peritoneal cancers.  Results of the assessment will determine the need for genetic counseling and BRCA1 and BRCA2 testing.  Cervical Cancer Your health care provider may recommend that you be screened regularly for cancer of the pelvic organs (ovaries, uterus, and vagina). This screening involves a pelvic examination,  including checking for microscopic changes to the surface of your cervix (Pap test). You may be encouraged to have this screening done every 3 years, beginning at age 57.  For women ages 50-65, health care providers may recommend pelvic exams and Pap testing every 3 years, or they may recommend the Pap and pelvic exam, combined with testing for human papilloma virus (HPV), every 5 years. Some types of HPV increase your risk of cervical cancer. Testing for HPV may also be done on women of any age with unclear Pap test results.  Other health care providers may not recommend any screening for nonpregnant women who are considered low risk for pelvic cancer and who do not have symptoms. Ask your health care provider if a screening pelvic exam is right for you.  If you have had past treatment for cervical cancer or a condition that could lead to cancer, you need Pap tests and screening for cancer for at least 20 years after your treatment. If Pap tests have been discontinued, your risk factors (such as having a new sexual partner) need to be reassessed to determine if screening should resume. Some women have medical problems that increase the chance of getting cervical cancer. In these cases, your health care provider may recommend more frequent screening and Pap tests.  Colorectal Cancer  This type of cancer can be detected and often prevented.  Routine colorectal cancer screening usually begins at 47 years of age and continues through 47 years of age.  Your health care provider may recommend screening at an earlier age if you have risk factors for colon cancer.  Your health care provider may also recommend using home test kits to check for hidden blood in the stool.  A small camera at the end of a tube can be used to examine your colon directly (sigmoidoscopy or colonoscopy). This is done to check for the earliest forms of colorectal cancer.  Routine screening usually begins at age 24.  Direct  examination of the colon should be repeated every 5-10 years through 47 years of age. However, you may need to be screened more often if early forms of precancerous polyps or small growths are found.  Skin Cancer  Check your skin from head to toe regularly.  Tell your health care provider about any new moles or changes in moles, especially if there is a change in a mole's shape or color.  Also tell your health care provider if you have a mole that is larger than the size of a pencil eraser.  Always use sunscreen. Apply sunscreen liberally and repeatedly throughout the day.  Protect yourself by wearing long sleeves, pants, a wide-brimmed hat, and sunglasses whenever you are outside.  Heart disease, diabetes, and high blood pressure  High blood pressure causes heart disease and increases the risk of stroke. High blood pressure is more likely to develop in: ? People who have blood pressure in the high end of the normal range (130-139/85-89 mm Hg). ? People who are  overweight or obese. ? People who are African American.  If you are 90-71 years of age, have your blood pressure checked every 3-5 years. If you are 62 years of age or older, have your blood pressure checked every year. You should have your blood pressure measured twice-once when you are at a hospital or clinic, and once when you are not at a hospital or clinic. Record the average of the two measurements. To check your blood pressure when you are not at a hospital or clinic, you can use: ? An automated blood pressure machine at a pharmacy. ? A home blood pressure monitor.  If you are between 15 years and 40 years old, ask your health care provider if you should take aspirin to prevent strokes.  Have regular diabetes screenings. This involves taking a blood sample to check your fasting blood sugar level. ? If you are at a normal weight and have a low risk for diabetes, have this test once every three years after 47 years of  age. ? If you are overweight and have a high risk for diabetes, consider being tested at a younger age or more often. Preventing infection Hepatitis B  If you have a higher risk for hepatitis B, you should be screened for this virus. You are considered at high risk for hepatitis B if: ? You were born in a country where hepatitis B is common. Ask your health care provider which countries are considered high risk. ? Your parents were born in a high-risk country, and you have not been immunized against hepatitis B (hepatitis B vaccine). ? You have HIV or AIDS. ? You use needles to inject street drugs. ? You live with someone who has hepatitis B. ? You have had sex with someone who has hepatitis B. ? You get hemodialysis treatment. ? You take certain medicines for conditions, including cancer, organ transplantation, and autoimmune conditions.  Hepatitis C  Blood testing is recommended for: ? Everyone born from [birth year] through 1965. ? Anyone with known risk factors for hepatitis C.  Sexually transmitted infections (STIs)  You should be screened for sexually transmitted infections (STIs) including gonorrhea and chlamydia if: ? You are sexually active and are younger than 47 years of age. ? You are older than 47 years of age and your health care provider tells you that you are at risk for this type of infection. ? Your sexual activity has changed since you were last screened and you are at an increased risk for chlamydia or gonorrhea. Ask your health care provider if you are at risk.  If you do not have HIV, but are at risk, it may be recommended that you take a prescription medicine daily to prevent HIV infection. This is called pre-exposure prophylaxis (PrEP). You are considered at risk if: ? You are sexually active and do not regularly use condoms or know the HIV status of your partner(s). ? You take drugs by injection. ? You are sexually active with a partner who has HIV.  Talk with your  health care provider about whether you are at high risk of being infected with HIV. If you choose to begin PrEP, you should first be tested for HIV. You should then be tested every 3 months for as long as you are taking PrEP. Pregnancy  If you are premenopausal and you may become pregnant, ask your health care provider about preconception counseling.  If you may become pregnant, take 400 to 800 micrograms (mcg) of folic acid  every day.  If you want to prevent pregnancy, talk to your health care provider about birth control (contraception). Osteoporosis and menopause  Osteoporosis is a disease in which the bones lose minerals and strength with aging. This can result in serious bone fractures. Your risk for osteoporosis can be identified using a bone density scan.  If you are 52 years of age or older, or if you are at risk for osteoporosis and fractures, ask your health care provider if you should be screened.  Ask your health care provider whether you should take a calcium or vitamin D supplement to lower your risk for osteoporosis.  Menopause may have certain physical symptoms and risks.  Hormone replacement therapy may reduce some of these symptoms and risks. Talk to your health care provider about whether hormone replacement therapy is right for you. Follow these instructions at home:  Schedule regular health, dental, and eye exams.  Stay current with your immunizations.  Do not use any tobacco products including cigarettes, chewing tobacco, or electronic cigarettes.  If you are pregnant, do not drink alcohol.  If you are breastfeeding, limit how much and how often you drink alcohol.  Limit alcohol intake to no more than 1 drink per day for nonpregnant women. One drink equals 12 ounces of beer, 5 ounces of wine, or 1 ounces of hard liquor.  Do not use street drugs.  Do not share needles.  Ask your health care provider for help if you need support or information about quitting  drugs.  Tell your health care provider if you often feel depressed.  Tell your health care provider if you have ever been abused or do not feel safe at home. This information is not intended to replace advice given to you by your health care provider. Make sure you discuss any questions you have with your health care provider. Document Released: 08/02/2010 Document Revised: 06/25/2015 Document Reviewed: 10/21/2014 [employer]vier Interactive Patient Education  [employer].

## 2017-03-02 NOTE — Progress Notes (Signed)
Subjective:   HPI  Elaine Brown is a 47 y.o. female who presents for physical Chief Complaint  Patient presents with  . Annual Exam    physical, congestion,painin left ear , coughing , no pap because period     Medical care team includes: Demauri Advincula, Kermit Balo, PA-C here for primary care Dentist Eye doctor dermatology  Concerns: She notes 3-4 day hx/o cold symptoms, no fever, no wheezing, no SOB, just ear pressure, congestion, some cough.  No other c/o.  Reviewed their medical, surgical, family, social, medication, and allergy history and updated chart as appropriate.  Past Medical History:  Diagnosis Date  . Diabetes mellitus without complication (HCC) 2004   possibly diagnosed 2004  . Family history of premature CAD    mother  . History of PCOS 2004   questionable  . Hyperlipidemia   . Hypertension 2000  . Neck deformity, acquired   . Obesity   . Wears glasses     Past Surgical History:  Procedure Laterality Date  . BREAST LUMPECTOMY     left  . CESAREAN SECTION    . MOUTH SURGERY  1989  . TUBAL LIGATION      Social History   Socioeconomic History  . Marital status: Married    Spouse name: Annette Stable   . Number of children: 2  . Years of education: some colle  . Highest education level: Not on file  Social Needs  . Financial resource strain: Not on file  . Food insecurity - worry: Not on file  . Food insecurity - inability: Not on file  . Transportation needs - medical: Not on file  . Transportation needs - non-medical: Not on file  Occupational History  . Occupation: Homemaker  Tobacco Use  . Smoking status: Never Smoker  . Smokeless tobacco: Never Used  Substance and Sexual Activity  . Alcohol use: No  . Drug use: No  . Sexual activity: Yes    Partners: Male  Other Topics Concern  . Not on file  Social History Narrative   Social History:   Pt lives with her husband, daughter born in 63 and son born in 2007 He has special needs.       [place] where her husband is the pastor.  She is not currently employeed.       01/2017    Family History  Problem Relation Age of Onset  . Coronary artery disease Mother        died age 53  . Diabetes Mother   . Stroke Mother   . Peripheral vascular disease Mother   . Heart disease Mother   . Hypertension Father   . Fragile X syndrome Sister   . Hypertension Maternal Grandmother   . Cancer Neg Hx      Current Outpatient Medications:  .  aspirin 81 MG tablet, Take 1 tablet (81 mg total) by mouth daily., Disp: 90 tablet, Rfl: 3 .  atorvastatin (LIPITOR) 40 MG tablet, Take 1 tablet (40 mg total) by mouth daily., Disp: 90 tablet, Rfl: 0 .  B Complex-C (SUPER B COMPLEX PO), Take 1 tablet by mouth daily. Reported on 03/23/2015, Disp: , Rfl:  .  calcium carbonate (TUMS - DOSED IN MG ELEMENTAL CALCIUM) 500 MG chewable tablet, Chew 1 tablet by mouth daily., Disp: , Rfl:  .  Calcium Carbonate-Vitamin D (CALTRATE 600+D) 600-400 MG-UNIT per chew tablet, Chew 1 tablet by mouth daily., Disp: 90 tablet, Rfl:  3 .  FLUoxetine (PROZAC) 20 MG capsule, Take 1 capsule (20 mg total) by mouth daily., Disp: 90 capsule, Rfl: 0 .  lisinopril-hydrochlorothiazide (PRINZIDE,ZESTORETIC) 20-25 MG tablet, Take 1 tablet by mouth daily., Disp: 90 tablet, Rfl: 1 .  metFORMIN (GLUCOPHAGE) 1000 MG tablet, 1.5 tabs in AM and 1 in PM, Disp: 225 tablet, Rfl: 1 .  Multiple Vitamins-Minerals (CENTRUM) tablet, Take 1 tablet by mouth daily.  , Disp: , Rfl:  .  ranitidine (ZANTAC) 300 MG tablet, Take 1 tablet (300 mg total) by mouth at bedtime., Disp: 90 tablet, Rfl: 0 .  Zinc 50 MG TABS, Take 1 tablet by mouth daily.  , Disp: , Rfl:  .  hydrOXYzine (ATARAX/VISTARIL) 10 MG tablet, Take 1 tablet (10 mg total) by mouth 3 (three) times daily as needed., Disp: 90 tablet, Rfl: 2  No Known Allergies     Review of Systems Constitutional: -fever, -chills, -sweats, -unexpected weight change,  -decreased appetite, -fatigue Allergy: -sneezing, +itching, -congestion Dermatology: -changing moles, --rash, -lumps ENT: -runny nose, -ear pain, -sore throat, -hoarseness, -sinus pain, -teeth pain, - ringing in ears, -hearing loss, -nosebleeds Cardiology: -chest pain, -palpitations, -swelling, -difficulty breathing when lying flat, -waking up short of breath Respiratory: +cough, -shortness of breath, -difficulty breathing with exercise or exertion, -wheezing, -coughing up blood Gastroenterology: -abdominal pain, -nausea, -vomiting, -diarrhea, -constipation, -blood in stool, -changes in bowel movement, -difficulty swallowing or eating Hematology: -bleeding, -bruising  Musculoskeletal: -joint aches, -muscle aches, -joint swelling, -back pain, -neck pain, -cramping, -changes in gait Ophthalmology: denies vision changes, eye redness, itching, discharge Urology: -burning with urination, -difficulty urinating, -blood in urine, -urinary frequency, -urgency, -incontinence Neurology: -headache, -weakness, -tingling, -numbness, -memory loss, -falls, -dizziness Psychology: -depressed mood, -agitation, -sleep problems Breast/gyn: -breast tendnerss, -discharge, -lumps, -vaginal discharge,- irregular periods, -heavy periods      Objective:   BP 138/80   Pulse 94   Temp 98.2 F (36.8 C)   Ht 5\' 3"  (1.6 m)   Wt 216 lb 6.4 oz (98.2 kg)   LMP 03/02/2017   SpO2 99%   BMI 38.33 kg/m   General appearance: alert, no distress, WD/WN, Caucasian female, obese Skin: numerous pink to red roundish scabs on both forearms from self picking, various stages of healing, no worrisome lesions othewrise HEENT: normocephalic, conjunctiva/corneas normal, sclerae anicteric, PERRLA, EOMi, nares patent, no discharge or erythema, pharynx normal Oral cavity: MMM, tongue normal, teeth in good repair Neck: supple, no lymphadenopathy, no thyromegaly, no masses, there is hypertrophied and tense SCM on right compared to much  less prominent SCM on left, 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, otherwise , no bruits Chest: non tender, normal shape and expansion Heart: RRR, normal S1, S2, no murmurs Lungs: CTA bilaterally, no wheezes, rhonchi, or rales Abdomen: +bs, soft, non tender, non distended, no masses, no hepatomegaly, no splenomegaly, no bruits Back: non tender, normal ROM, no scoliosis Musculoskeletal: upper extremities non tender, no obvious deformity, normal ROM throughout, lower extremities non tender, no obvious deformity, normal ROM throughout Extremities: no edema, no cyanosis, no clubbing Pulses: 2+ symmetric, upper and lower extremities, normal cap refill Neurological: alert, oriented x 3, CN2-12 intact, strength normal upper extremities and lower extremities, sensation normal throughout, DTRs 2+ throughout, no cerebellar signs, gait normal Psychiatric: normal affect, behavior normal, pleasant  Breast/gyn/rectal - deferred due to currently on period  Diabetic Foot Exam - Simple   Simple Foot Form Diabetic Foot exam was performed with the following findings:  Yes 03/02/2017  9:47 AM  Visual Inspection See comments:  Yes Sensation Testing Intact to touch and monofilament testing bilaterally:  Yes Pulse Check Posterior Tibialis and Dorsalis pulse intact bilaterally:  Yes Comments Thickened 2nd toenails bilat, flat feet bilat      Adult ECG Report  Indication: physical, diabetes, HTN  Rate: 81 bpm  Rhythm: normal sinus rhythm  QRS Axis: 38 degrees  PR Interval:  QRS Duration: 88ms  QTc:  Conduction Disturbances: poor R wave progression, nonspecific findings in III  Other Abnormalities: none  Patient's cardiac risk factors are: diabetes mellitus, hypertension and obesity (BMI >= 30 kg/m2).  EKG comparison: none  Narrative Interpretation: abnormal EKG      Assessment and Plan :    Encounter Diagnoses  Name Primary?  . Encounter for health maintenance  examination in adult Yes  . Essential hypertension   . Diabetes mellitus without complication   . Hyperlipidemia, unspecified hyperlipidemia type   . Influenza vaccination declined   . Neck deformity, acquired   . Obesity with serious comorbidity, unspecified classification, unspecified obesity type   . Screening for breast cancer   . Vaccine counseling   . Screening for cervical cancer   . History of tubal ligation   . Upper respiratory tract infection, unspecified type   . Skin picking habit     Physical exam - discussed and counseled on healthy lifestyle, diet, exercise, preventative care, vaccinations, sick and well care, proper use of emergency dept and after hours care, and addressed their concerns.    Health screening: See your eye doctor yearly for routine vision care. See your dentist yearly for routine dental care including hygiene visits twice yearly.  Cancer screening Discussed and advised monthly self breast exams Discussed mammogram, she will schedule an updated mammogram Discussed pap smear recommendations.   Return for pap smear at her convenient Discussed colonoscopy screening age 21yo unless higher risk for earlier screening  Vaccinations: Recommended tetanus booster in 2 years, yearly flu shot, pneumococcal vaccine, hep B vaccine. She reports that she has had the hep B series, but declines flu shot and pneumococcal vaccine   Acute issues discussed: URI-discussed supportive care, rest, hydration  Separate significant chronic issues discussed: Hypertension, hyperlipidemia, diabetes-labs today, continue current medications  Diabetes-advised yearly eye doctor visit, daily foot exam, routine follow-up.,  Glucose monitoring  Obesity-work on efforts to lose weight through healthy diet and exercise  Skin picking habit-begin trial of hydroxyzine tablet  Abnormal EKG, risk factors for heart disease - discussed cardiology referral. She will consider and let me  know  Asal was seen today for annual exam.  Diagnoses and all orders for this visit:  Encounter for health maintenance examination in adult -     CBC -     Comprehensive metabolic panel -     Lipid panel -     TSH -     HM DIABETES EYE EXAM -     HM DIABETES FOOT EXAM -     Hemoglobin A1c -     Microalbumin / creatinine urine ratio -     MM DIGITAL SCREENING BILATERAL; Future -     EKG 12-Lead  Essential hypertension -     EKG 12-Lead  Diabetes mellitus without complication -     HM DIABETES EYE EXAM -     HM DIABETES FOOT EXAM -     Hemoglobin A1c -     Microalbumin / creatinine urine ratio -     EKG 12-Lead  Hyperlipidemia,  unspecified hyperlipidemia type -     EKG 12-Lead  Influenza vaccination declined  Neck deformity, acquired  Obesity with serious comorbidity, unspecified classification, unspecified obesity type  Screening for breast cancer -     MM DIGITAL SCREENING BILATERAL; Future  Vaccine counseling  Screening for cervical cancer  History of tubal ligation  Upper respiratory tract infection, unspecified type  Skin picking habit  Other orders -     hydrOXYzine (ATARAX/VISTARIL) 10 MG tablet; Take 1 tablet (10 mg total) by mouth 3 (three) times daily as needed.    Follow-up pending labs, yearly for physical

## 2017-03-03 LAB — COMPREHENSIVE METABOLIC PANEL
A/G RATIO: 1.4 (ref 1.2–2.2)
ALT: 20 IU/L (ref 0–32)
AST: 16 IU/L (ref 0–40)
Albumin: 4.1 g/dL (ref 3.5–5.5)
Alkaline Phosphatase: 72 IU/L (ref 39–117)
BILIRUBIN TOTAL: 0.3 mg/dL (ref 0.0–1.2)
BUN/Creatinine Ratio: 10 (ref 9–23)
BUN: 6 mg/dL (ref 6–24)
CALCIUM: 9 mg/dL (ref 8.7–10.2)
CHLORIDE: 99 mmol/L (ref 96–106)
CO2: 24 mmol/L (ref 20–29)
Creatinine, Ser: 0.61 mg/dL (ref 0.57–1.00)
GFR calc non Af Amer: 109 mL/min/{1.73_m2} (ref >59)
GFR, EST AFRICAN AMERICAN: 126 mL/min/{1.73_m2} (ref >59)
GLUCOSE: 127 mg/dL — AB (ref 65–99)
Globulin, Total: 2.9 g/dL (ref 1.5–4.5)
POTASSIUM: 4.4 mmol/L (ref 3.5–5.2)
Sodium: 137 mmol/L (ref 134–144)
TOTAL PROTEIN: 7 g/dL (ref 6.0–8.5)

## 2017-03-03 LAB — LIPID PANEL
Chol/HDL Ratio: 2.7 ratio (ref 0.0–4.4)
Cholesterol, Total: 122 mg/dL (ref 100–199)
HDL: 46 mg/dL (ref >39)
LDL Calculated: 61 mg/dL (ref 0–99)
TRIGLYCERIDES: 75 mg/dL (ref 0–149)
VLDL Cholesterol Cal: 15 mg/dL (ref 5–40)

## 2017-03-03 LAB — CBC
Hematocrit: 30.8 % — ABNORMAL LOW (ref 34.0–46.6)
Hemoglobin: 10.3 g/dL — ABNORMAL LOW (ref 11.1–15.9)
MCH: 26.1 pg — AB (ref 26.6–33.0)
MCHC: 33.4 g/dL (ref 31.5–35.7)
MCV: 78 fL — AB (ref 79–97)
PLATELETS: 323 10*3/uL (ref 150–379)
RBC: 3.94 x10E6/uL (ref 3.77–5.28)
RDW: 16.6 % — AB (ref 12.3–15.4)
WBC: 4.7 10*3/uL (ref 3.4–10.8)

## 2017-03-03 LAB — MICROALBUMIN / CREATININE URINE RATIO
CREATININE, UR: 33.7 mg/dL
MICROALB/CREAT RATIO: 30.6 mg/g{creat} — AB (ref 0.0–30.0)
MICROALBUM., U, RANDOM: 10.3 ug/mL

## 2017-03-03 LAB — TSH: TSH: 1.66 u[IU]/mL (ref 0.450–4.500)

## 2017-03-03 LAB — HEMOGLOBIN A1C
ESTIMATED AVERAGE GLUCOSE: 163 mg/dL
Hgb A1c MFr Bld: 7.3 % — ABNORMAL HIGH (ref 4.8–5.6)

## 2017-03-08 ENCOUNTER — Other Ambulatory Visit: Payer: Self-pay

## 2017-03-08 DIAGNOSIS — R000 Tachycardia, unspecified: Secondary | ICD-10-CM

## 2017-03-09 ENCOUNTER — Encounter: Payer: Self-pay | Admitting: Internal Medicine

## 2017-03-22 ENCOUNTER — Other Ambulatory Visit: Payer: BLUE CROSS/BLUE SHIELD | Admitting: Medical

## 2017-03-29 ENCOUNTER — Other Ambulatory Visit: Payer: Self-pay | Admitting: Medical

## 2017-03-30 ENCOUNTER — Ambulatory Visit
Admission: RE | Admit: 2017-03-30 | Discharge: 2017-03-30 | Disposition: A | Discharge status: Home / Self Care | Payer: BLUE CROSS/BLUE SHIELD | Source: Ambulatory Visit | Attending: Medical | Admitting: Medical

## 2017-03-30 DIAGNOSIS — Z0000 Encounter for general adult medical examination without abnormal findings: Secondary | ICD-10-CM

## 2017-03-30 DIAGNOSIS — Z1231 Encounter for screening mammogram for malignant neoplasm of breast: Secondary | ICD-10-CM | POA: Diagnosis not present

## 2017-03-30 DIAGNOSIS — Z1239 Encounter for other screening for malignant neoplasm of breast: Secondary | ICD-10-CM

## 2017-04-03 ENCOUNTER — Encounter: Payer: Self-pay | Admitting: Physician Assistant

## 2017-04-03 ENCOUNTER — Ambulatory Visit: Payer: BLUE CROSS/BLUE SHIELD | Admitting: Physician Assistant

## 2017-04-03 VITALS — BP 140/78 | HR 93 | Ht 63.0 in | Wt 220.8 lb

## 2017-04-03 DIAGNOSIS — E785 Hyperlipidemia, unspecified: Secondary | ICD-10-CM | POA: Diagnosis not present

## 2017-04-03 DIAGNOSIS — R011 Cardiac murmur, unspecified: Secondary | ICD-10-CM | POA: Diagnosis not present

## 2017-04-03 DIAGNOSIS — E119 Type 2 diabetes mellitus without complications: Secondary | ICD-10-CM

## 2017-04-03 DIAGNOSIS — I10 Essential (primary) hypertension: Secondary | ICD-10-CM

## 2017-04-03 DIAGNOSIS — R0989 Other specified symptoms and signs involving the circulatory and respiratory systems: Secondary | ICD-10-CM | POA: Diagnosis not present

## 2017-04-03 DIAGNOSIS — Z8249 Family history of ischemic heart disease and other diseases of the circulatory system: Secondary | ICD-10-CM

## 2017-04-03 NOTE — Progress Notes (Signed)
Cardiology Office Note:    Date:  04/03/2017   ID:  Elaine Brown, DOB [birth year], MRN [id number]  PCP:  Jac Canavan, PA-C  Cardiologist:   New - requests Dr. Tonny Bollman if chronic follow up needed   Referring MD: Tysinger, Kermit Balo, PA-C   Chief Complaint  Patient presents with  . Abnormal ECG    History of Present Illness:    Elaine Brown is a 47 y.o. female with diabetes, hypertension, hyperlipidemia who is being seen today for the evaluation of abnormal EKG at the request of Jac Canavan, PA-C.   Ms. Casalino is here today with her husband and daughter.  She does not have a known history of coronary artery disease, congestive heart failure, stroke.  Her mother did have significant carotid stenosis and passed away from a stroke prior to having a CEA.  She recently established with her PCP and given her FHx, she was asked to be evaluated by Cardiology.  She denies any history of chest pain or significant shortness of breath.  She denies a history of syncope.  She has not had any palpitations, paroxysmal nocturnal dyspnea or significant leg swelling.  She does note some dependent edema after prolonged standing.    PAD Screen 04/03/2017  Previous PAD dx? No  Previous surgical procedure? No  Pain with walking? No  Feet/toe relief with dangling? No  Painful, non-healing ulcers? No  Extremities discolored? No    Prior CV studies:   The following studies were reviewed today:  None   Past Medical History:  Diagnosis Date  . Diabetes mellitus without complication (HCC) 2004   possibly diagnosed 2004  . Family history of premature CAD    mother  . History of PCOS 2004   questionable  . Hyperlipidemia   . Hypertension 2000  . Neck deformity, acquired   . Obesity   . Wears glasses     Past Surgical History:  Procedure Laterality Date  . BREAST LUMPECTOMY     left  . CESAREAN SECTION    . MOUTH SURGERY  1989  . TUBAL LIGATION      Current  Medications: Current Meds  Medication Sig  . aspirin 81 MG tablet Take 1 tablet (81 mg total) by mouth daily.  Marland Kitchen atorvastatin (LIPITOR) 40 MG tablet TAKE 1 TABLET BY MOUTH DAILY  . B Complex-C (SUPER B COMPLEX PO) Take 1 tablet by mouth daily. Reported on 03/23/2015  . Calcium Carbonate-Vitamin D (CALTRATE 600+D) 600-400 MG-UNIT per chew tablet Chew 1 tablet by mouth daily.  Marland Kitchen CRANBERRY PO Take 1 tablet by mouth daily. OTC SUPPLEMENT  . FLUoxetine (PROZAC) 20 MG capsule Take 1 capsule (20 mg total) by mouth daily.  . hydrOXYzine (ATARAX/VISTARIL) 10 MG tablet Take 1 tablet (10 mg total) by mouth 3 (three) times daily as needed.  Marland Kitchen lisinopril-hydrochlorothiazide (PRINZIDE,ZESTORETIC) 20-25 MG tablet Take 1 tablet by mouth daily.  . metFORMIN (GLUCOPHAGE) 1000 MG tablet 1.5 tabs in AM and 1 in PM  . Multiple Vitamins-Minerals (CENTRUM) tablet Take 1 tablet by mouth daily.    . ranitidine (ZANTAC) 300 MG tablet TAKE 1 TABLET BY MOUTH AT BEDTIME  . Zinc 50 MG TABS Take 1 tablet by mouth daily.       Allergies:   Patient has no known allergies.   Social History   Socioeconomic History  . Marital status: Married    Spouse name: Annette Stable   . Number of children: 2  . Years  of education: some colle  . Highest education level: None  Social Needs  . Financial resource strain: None  . Food insecurity - worry: None  . Food insecurity - inability: None  . Transportation needs - medical: None  . Transportation needs - non-medical: None  Occupational History  . Occupation: Homemaker  Tobacco Use  . Smoking status: Never Smoker  . Smokeless tobacco: Never Used  Substance and Sexual Activity  . Alcohol use: No  . Drug use: No  . Sexual activity: Yes    Partners: Male  Other Topics Concern  . None  Social History Narrative   Social History:   Pt lives with her husband, daughter born in 2005 and son born in 2007 He has special needs.    She [place] the [place] where her  husband is the pastor.  She is not currently employeed.    01/2017     Family Hx: The patient's family history includes Diabetes in her mother; Fragile X syndrome in her sister; Heart attack in her maternal uncle; Heart disease in her mother; Hypertension in her father and maternal grandmother; Peripheral vascular disease in her mother; Stroke in her mother. There is no history of Cancer.  ROS:   Please see the history of present illness.    Review of Systems  Cardiovascular: Positive for leg swelling.   All other systems reviewed and are negative.   EKGs/Labs/Other Test Reviewed:    EKG:  EKG is  ordered today.  The ekg ordered today demonstrates normal sinus rhythm, heart rate 93, normal axis, poor R wave progression, QTC 452 ms  ECG from 03/02/17 reviewed-this demonstrates normal sinus rhythm, heart rate 80, nonspecific ST-T wave changes, poor R wave progression, QTC 449 ms  Recent Labs: 03/02/2017: ALT 20; BUN 6; Creatinine, Ser 0.61; Hemoglobin 10.3; Platelets 323; Potassium 4.4; Sodium 137; TSH 1.660   Recent Lipid Panel Lab Results  Component Value Date/Time   CHOL 122 03/02/2017 09:24 AM   TRIG 75 03/02/2017 09:24 AM   HDL 46 03/02/2017 09:24 AM   CHOLHDL 2.7 03/02/2017 09:24 AM   CHOLHDL 3.9 03/30/2016 09:21 AM   LDLCALC 61 03/02/2017 09:24 AM    Physical Exam:    VS:  BP 140/78 (BP Location: Right Arm, Patient Position: Sitting, Cuff Size: Large)   Pulse 93   Ht 5\' 3"  (1.6 m)   Wt 220 lb 12.8 oz (100.2 kg)   SpO2 97%   BMI 39.11 kg/m     Wt Readings from Last 3 Encounters:  04/03/17 220 lb 12.8 oz (100.2 kg)  03/02/17 216 lb 6.4 oz (98.2 kg)  01/04/17 210 lb 6.4 oz (95.4 kg)     Physical Exam  Constitutional: She is oriented to person, place, and time. She appears well-developed and well-nourished. No distress.  HENT:  Head: Normocephalic and atraumatic.  Eyes: No scleral icterus.  Neck: No JVD present. Carotid bruit is present (?R carotid bruit vs  radiating murmur).  Cardiovascular: Normal rate, regular rhythm, S1 normal and S2 normal.  Murmur heard.  Low-pitched early systolic murmur is present with a grade of 2/6 at the upper right sternal border. Pulmonary/Chest: Effort normal. She has no rales.  Abdominal: Soft.  Musculoskeletal: She exhibits no edema.  Neurological: She is alert and oriented to person, place, and time.  Skin: Skin is warm and dry.    ASSESSMENT & PLAN:    #1.  Family history of early CAD  She has a  significant family history of coronary artery disease and peripheral arterial disease.  Her mother passed away of a stroke in her 38s.  She does not have any symptoms to suggest angina.  She has no evidence of ischemic heart disease on her ECG.  However, given her risk factors (DM, HTN, HL) and her FHx, I have offered her a plain exercise treadmill test to screen for ischemic heart disease.  If this is low risk, she can follow up with Cardiology as needed.  Her case was reviewed with attending MD today, Dr. Sherryl Manges.  - Arrange plain GXT   #2.  Murmur  She has what sounds like a functional murmur on exam.  No symptoms to suggest significant valvular heart disease.  She does have poor R wave progression on ECG but no Q waves. Will obtain an echocardiogram to look at EF, wall motion and rule out valvular heart disease.  -Arrange Echo  #3.  Bruit of right carotid artery  I suspect her bruit is probably her murmur radiating up into her carotids.  However, she is significantly concerned about carotid disease given her mother's history.  She does have a risk equivalent with diabetes.  - Plan: VAS US CAROTID  #4.  Essential hypertension Goal blood pressure less than 130/80.  I have asked her to continue to monitor blood pressures.  If her blood pressure remains above target, consider addition of a beta-blocker for improved blood pressure control.  #5.  Diabetes mellitus without complication Continue follow-up with  primary care for aggressive management.  #6.  Hyperlipidemia, unspecified hyperlipidemia type   Recent LDL less than 70.  Continue moderate intensity statin.  Continue follow-up with primary care.   Dispo:  Return As needed depending on test results.   Medication Adjustments/Labs and Tests Ordered: Current medicines are reviewed at length with the patient today.  Concerns regarding medicines are outlined above.  Orders/Tests:  Orders Placed This Encounter  Procedures  . Exercise Tolerance Test  . EKG 12-Lead  . ECHOCARDIOGRAM COMPLETE   Medication changes: No orders of the defined types were placed in this encounter.  Signed, Tereso Newcomer, PA-C  04/03/2017 10:51 AM    [facility] [address] [place], [place], [place]  [address] Phone: [phone]; Fax: [phone]

## 2017-04-03 NOTE — Patient Instructions (Signed)
Medication Instructions:  1. Your physician recommends that you continue on your current medications as directed. Please refer to the Current Medication list given to you today.   Labwork: NONE ORDERED TODAY  Testing/Procedures: 1. Your physician has requested that you have an echocardiogram. Echocardiography is a painless test that uses sound waves to create images of your heart. It provides your doctor with information about the size and shape of your heart and how well your heart's chambers and valves are working. This procedure takes approximately one hour. There are no restrictions for this procedure.  2. Your physician has requested that you have a carotid duplex. This test is an ultrasound of the carotid arteries in your neck. It looks at blood flow through these arteries that supply the brain with blood. Allow one hour for this exam. There are no restrictions or special instructions.  3. Your physician has requested that you have an exercise tolerance test. For further information please visit [email or web address]. Please also follow instruction sheet, as given.    Follow-Up: FOLLOW UP AS NEEDED PENDING TEST RESULTS.   Any Other Special Instructions Will Be Listed Below (If Applicable).     If you need a refill on your cardiac medications before your next appointment, please call your pharmacy.

## 2017-04-11 ENCOUNTER — Ambulatory Visit (HOSPITAL_COMMUNITY)
Admission: RE | Admit: 2017-04-11 | Discharge: 2017-04-11 | Disposition: A | Discharge status: Home / Self Care | Payer: BLUE CROSS/BLUE SHIELD | Source: Ambulatory Visit | Attending: Cardiovascular Disease | Admitting: Cardiovascular Disease

## 2017-04-11 DIAGNOSIS — R0989 Other specified symptoms and signs involving the circulatory and respiratory systems: Secondary | ICD-10-CM

## 2017-04-12 ENCOUNTER — Telehealth: Payer: Self-pay | Admitting: *Deleted

## 2017-04-12 ENCOUNTER — Encounter: Payer: Self-pay | Admitting: Physician Assistant

## 2017-04-12 NOTE — Telephone Encounter (Signed)
Pt has been notified of carotid u/s results by phone with verbal understanding. Pt thanked me for my call. Pt agreeable to plan of care. I will forward a copy of results to PCP.

## 2017-04-12 NOTE — Telephone Encounter (Signed)
-----   Message from Beatrice LecherScott T Weaver, [place]PA-C sent at 04/12/2017  5:04 PM EDT ----- Please call the patient. The carotid ultrasound demonstrates no significant ICA stenosis. Continue current medications and follow up as planned.  Please fax a copy of this study result to her PCP:  Tysinger, Kermit Baloavid S, PA-C  Thanks! Tereso NewcomerScott Weaver, PA-C    04/12/2017 5:02 PM

## 2017-04-14 ENCOUNTER — Other Ambulatory Visit: Payer: Self-pay

## 2017-04-14 ENCOUNTER — Ambulatory Visit (INDEPENDENT_AMBULATORY_CARE_PROVIDER_SITE_OTHER): Payer: BLUE CROSS/BLUE SHIELD

## 2017-04-14 ENCOUNTER — Ambulatory Visit (HOSPITAL_COMMUNITY): Payer: BLUE CROSS/BLUE SHIELD | Attending: Cardiovascular Disease

## 2017-04-14 DIAGNOSIS — I119 Hypertensive heart disease without heart failure: Secondary | ICD-10-CM | POA: Diagnosis not present

## 2017-04-14 DIAGNOSIS — E785 Hyperlipidemia, unspecified: Secondary | ICD-10-CM | POA: Diagnosis not present

## 2017-04-14 DIAGNOSIS — Z8249 Family history of ischemic heart disease and other diseases of the circulatory system: Secondary | ICD-10-CM | POA: Diagnosis not present

## 2017-04-14 DIAGNOSIS — R000 Tachycardia, unspecified: Secondary | ICD-10-CM | POA: Insufficient documentation

## 2017-04-14 DIAGNOSIS — E669 Obesity, unspecified: Secondary | ICD-10-CM | POA: Diagnosis not present

## 2017-04-14 DIAGNOSIS — E119 Type 2 diabetes mellitus without complications: Secondary | ICD-10-CM | POA: Diagnosis not present

## 2017-04-14 DIAGNOSIS — R011 Cardiac murmur, unspecified: Secondary | ICD-10-CM | POA: Diagnosis not present

## 2017-04-14 LAB — EXERCISE TOLERANCE TEST
CHL CUP RESTING HR STRESS: 97 {beats}/min
CHL RATE OF PERCEIVED EXERTION: 17
CSEPED: 4 min
CSEPEDS: 0 s
CSEPPHR: 160 {beats}/min
Estimated workload: 5.8 METS
MPHR: 174 {beats}/min
Percent HR: 91 %

## 2017-04-16 ENCOUNTER — Encounter: Payer: Self-pay | Admitting: Physician Assistant

## 2017-05-20 LAB — HM DIABETES EYE EXAM

## 2017-05-25 ENCOUNTER — Encounter: Payer: Self-pay | Admitting: Medical

## 2017-05-25 DIAGNOSIS — H52203 Unspecified astigmatism, bilateral: Secondary | ICD-10-CM | POA: Diagnosis not present

## 2017-05-25 DIAGNOSIS — E119 Type 2 diabetes mellitus without complications: Secondary | ICD-10-CM | POA: Diagnosis not present

## 2017-06-27 ENCOUNTER — Ambulatory Visit: Payer: BLUE CROSS/BLUE SHIELD | Admitting: Medical

## 2017-06-27 ENCOUNTER — Encounter: Payer: Self-pay | Admitting: Medical

## 2017-06-27 VITALS — BP 142/80 | HR 91 | Temp 97.8°F | Ht 65.0 in | Wt 219.2 lb

## 2017-06-27 DIAGNOSIS — E119 Type 2 diabetes mellitus without complications: Secondary | ICD-10-CM | POA: Diagnosis not present

## 2017-06-27 DIAGNOSIS — H6122 Impacted cerumen, left ear: Secondary | ICD-10-CM | POA: Diagnosis not present

## 2017-06-27 DIAGNOSIS — R42 Dizziness and giddiness: Secondary | ICD-10-CM | POA: Diagnosis not present

## 2017-06-27 LAB — POCT CBG (FASTING - GLUCOSE)-MANUAL ENTRY: Glucose Fasting, POC: 163 mg/dL — AB (ref 70–99)

## 2017-06-27 LAB — POCT GLYCOSYLATED HEMOGLOBIN (HGB A1C): Hemoglobin A1C: 7.7 % — AB (ref 4.0–5.6)

## 2017-06-27 NOTE — Progress Notes (Signed)
Subjective:  Elaine Brown is a 47 y.o. female who presents for Chief Complaint  Patient presents with  . Dizziness    x 4 days worsening today with nausea      Here for dizziness for several days, "vertigo".   Head is swimmy, feels nauseated.   Took hydroxyzine she had at home as well as Zofran.   Had similar spell a year and a half ago.   Had lots of wax in left ear yesterday.  No recent URI symptoms.  Had some allergy problems a few weeks ago.   No dyspnea, no chest pain, no swelling more than normal, no numbness, no tingling.   Hasn't been checking sugars or BP lately.  No polyuria, no polydipsia.  Currently taking Metformin.  Was on glipizide prior in pregnancy.  No vision change.  No other aggravating or relieving factors.    No other c/o.  The following portions of the patient's history were reviewed and updated as appropriate: allergies, current medications, past family history, past medical history, past social history, past surgical history and problem list.  ROS Otherwise as in subjective above   Objective: BP (!) 142/80   Pulse 91   Temp 97.8 F (36.6 C) (Oral)   Ht  (1.651 m)   Wt 219 lb 3.2 oz (99.4 kg)   SpO2 98%   BMI 36.48 kg/m   Wt Readings from Last 3 Encounters:  06/27/17 219 lb 3.2 oz (99.4 kg)  04/03/17 220 lb 12.8 oz (100.2 kg)  03/02/17 216 lb 6.4 oz (98.2 kg)   General appearance: alert, no distress, well developed, well nourished HEENT: normocephalic, sclerae anicteric, conjunctiva pink and moist, impacted cerumen left ear, right TM flat, nares patent, no discharge or erythema, pharynx normal Skin a little clammy Oral cavity: MMM, no lesions Neck: supple, no lymphadenopathy, no thyromegaly, no masses Heart: RRR, normal S1, S2, no murmurs Lungs: CTA bilaterally, no wheezes, rhonchi, or rales Abdomen: +bs, soft, non tender, non distended, no masses, no hepatomegaly, no splenomegaly Pulses: 2+ radial pulses, 2+ pedal pulses, normal cap  refill Ext: 1+ bilat nonpitting edema Neuro: nonfocal exam, no weakness, normal sensation and DTRs.     Assessment: Encounter Diagnoses  Name Primary?  . Dizziness Yes  . Vertigo   . Impacted cerumen of left ear   . Diabetes mellitus without complication      Plan: Dizziness , vertigo - can use meclizine and zofran as below.  discussed symptom that suggest vertigo.   If not much improved in the next 4-5 days, call or recheck.  Discussed findings.  Discussed risk/benefits of procedure and patient agrees to procedure. Successfully used warm water lavage to remove impacted cerumen from left ear canal. Patient tolerated procedure well. Advised they avoid using any cotton swabs or other devices to clean the ear canals.  Use basic hygiene as discussed.  Follow up prn.   Diabetes - Hgb A1C 7.7% and glucose 163 today.  Discussed medications, findings.   Pending call back, c/t Metformin but likely add glipizide   Shakyra was seen today for dizziness.  Diagnoses and all orders for this visit:  Dizziness  Vertigo  Impacted cerumen of left ear  Diabetes mellitus without complication   Follow up: pending call back

## 2017-06-28 ENCOUNTER — Telehealth: Payer: Self-pay

## 2017-06-28 ENCOUNTER — Other Ambulatory Visit: Payer: Self-pay | Admitting: Medical

## 2017-06-28 MED ORDER — MECLIZINE HCL 25 MG PO TABS: 25.0000 mg | ORAL_TABLET | Freq: Two times a day (BID) | ORAL | 0 refills | Status: AC

## 2017-06-28 MED ORDER — GLIPIZIDE 5 MG PO TABS: 5.0000 mg | ORAL_TABLET | Freq: Every day | ORAL | 2 refills | Status: DC

## 2017-06-28 MED ORDER — ONDANSETRON HCL 4 MG PO TABS: 4.0000 mg | ORAL_TABLET | Freq: Every day | ORAL | 1 refills | Status: AC | PRN

## 2017-06-28 NOTE — Progress Notes (Signed)
Elaine Brown

## 2017-06-28 NOTE — Telephone Encounter (Signed)
Pt has been informed that medication was sent to pharmacy. Pt stated does not know if she needs a refill on her metformin but if she does she will inform the pharmacy so they can request it.

## 2017-06-28 NOTE — Telephone Encounter (Signed)
I sent them, and  I added Glipizide to use once daily along with metformin for sugar control since her HgbA1C was elevated.    Does she need refill on Metformin?

## 2017-06-28 NOTE — Telephone Encounter (Signed)
Pt called because she did not get a prescription for Meclizine or antivert. Please advise.

## 2017-06-30 ENCOUNTER — Other Ambulatory Visit: Payer: Self-pay | Admitting: Medical

## 2017-09-11 ENCOUNTER — Other Ambulatory Visit: Payer: Self-pay | Admitting: Medical

## 2017-09-26 ENCOUNTER — Other Ambulatory Visit: Payer: Self-pay | Admitting: Medical

## 2017-09-26 ENCOUNTER — Other Ambulatory Visit: Payer: Self-pay | Admitting: Family Medicine

## 2017-09-29 ENCOUNTER — Other Ambulatory Visit: Payer: Self-pay | Admitting: Medical

## 2017-09-29 ENCOUNTER — Telehealth: Payer: Self-pay

## 2017-09-29 NOTE — Telephone Encounter (Signed)
Is this ok to refill?  Looks like another provider refused refill yesterday.

## 2017-09-29 NOTE — Telephone Encounter (Signed)
Patient rx for prozac was denied by another provider

## 2017-10-10 ENCOUNTER — Other Ambulatory Visit: Payer: Self-pay | Admitting: Medical

## 2017-10-13 ENCOUNTER — Other Ambulatory Visit: Payer: Self-pay | Admitting: Family Medicine

## 2017-10-13 ENCOUNTER — Other Ambulatory Visit: Payer: Self-pay | Admitting: Medical

## 2017-11-07 ENCOUNTER — Other Ambulatory Visit: Payer: Self-pay | Admitting: Medical

## 2017-11-30 ENCOUNTER — Encounter: Payer: Self-pay | Admitting: Podiatry

## 2017-11-30 ENCOUNTER — Ambulatory Visit: Payer: BLUE CROSS/BLUE SHIELD | Admitting: Podiatry

## 2017-11-30 ENCOUNTER — Ambulatory Visit (INDEPENDENT_AMBULATORY_CARE_PROVIDER_SITE_OTHER): Payer: BLUE CROSS/BLUE SHIELD

## 2017-11-30 DIAGNOSIS — M722 Plantar fascial fibromatosis: Secondary | ICD-10-CM

## 2017-11-30 DIAGNOSIS — M7662 Achilles tendinitis, left leg: Secondary | ICD-10-CM | POA: Diagnosis not present

## 2017-11-30 MED ORDER — METHYLPREDNISOLONE 4 MG PO TBPK: ORAL_TABLET | ORAL | 0 refills | Status: DC

## 2017-11-30 NOTE — Progress Notes (Signed)
She presents today chief complaint of pain to the posterior aspect of her left heel.  States is been bothering her for the past couple of months.  Mornings are particularly bad she is tried ibuprofen and stretching as needed.  States that her blood sugars are in pretty good range.  Objective: Vital signs are stable she is alert and oriented x3.  Pulses are palpable.  She has pain on palpation to the Achilles at its insertion site on the posterior aspect of the left heel.  Radiographs taken today demonstrate a large retrocalcaneal heel spur with thickening of the Achilles tendon as it inserts on the heel.  I feel no disruption in the integrity of the tendon.  Assessment: Insertional Achilles tendinitis retrocalcaneal heel spur left.  Plan: At this point offered her an injection and casting and she declined.  She states that she will do some stretching exercises and I placed her on a steroid pack.  Follow-up with her in the near future.  Did advise her to be very careful of her blood sugars that this will increase her blood sugars.

## 2017-11-30 NOTE — Patient Instructions (Signed)

## 2017-12-04 ENCOUNTER — Other Ambulatory Visit: Payer: Self-pay | Admitting: Medical

## 2017-12-04 NOTE — Telephone Encounter (Signed)
Is this ok to refill?  

## 2017-12-04 NOTE — Telephone Encounter (Signed)
Refill but get in for med check soon.   Of note, her son has been in the hospital, so work with her on medication and scheduling

## 2017-12-05 NOTE — Telephone Encounter (Signed)
Patient notified and scheduled appointment for next Thursday.

## 2017-12-14 ENCOUNTER — Ambulatory Visit: Payer: BLUE CROSS/BLUE SHIELD | Admitting: Medical

## 2017-12-14 ENCOUNTER — Encounter: Payer: Self-pay | Admitting: Medical

## 2017-12-14 VITALS — BP 124/80 | HR 90 | Temp 98.1°F | Resp 16 | Ht 65.0 in | Wt 216.8 lb

## 2017-12-14 DIAGNOSIS — E785 Hyperlipidemia, unspecified: Secondary | ICD-10-CM

## 2017-12-14 DIAGNOSIS — D649 Anemia, unspecified: Secondary | ICD-10-CM | POA: Diagnosis not present

## 2017-12-14 DIAGNOSIS — I10 Essential (primary) hypertension: Secondary | ICD-10-CM

## 2017-12-14 DIAGNOSIS — E1169 Type 2 diabetes mellitus with other specified complication: Secondary | ICD-10-CM

## 2017-12-14 DIAGNOSIS — Z7189 Other specified counseling: Secondary | ICD-10-CM

## 2017-12-14 DIAGNOSIS — E669 Obesity, unspecified: Secondary | ICD-10-CM

## 2017-12-14 DIAGNOSIS — Z129 Encounter for screening for malignant neoplasm, site unspecified: Secondary | ICD-10-CM

## 2017-12-14 DIAGNOSIS — Z7185 Encounter for immunization safety counseling: Secondary | ICD-10-CM

## 2017-12-14 DIAGNOSIS — Z2821 Immunization not carried out because of patient refusal: Secondary | ICD-10-CM | POA: Insufficient documentation

## 2017-12-14 LAB — CBC WITH DIFFERENTIAL/PLATELET
BASOS: 1 %
Basophils Absolute: 0 10*3/uL (ref 0.0–0.2)
EOS (ABSOLUTE): 0.1 10*3/uL (ref 0.0–0.4)
EOS: 1 %
HEMATOCRIT: 31.4 % — AB (ref 34.0–46.6)
Hemoglobin: 9.9 g/dL — ABNORMAL LOW (ref 11.1–15.9)
IMMATURE GRANS (ABS): 0 10*3/uL (ref 0.0–0.1)
IMMATURE GRANULOCYTES: 0 %
LYMPHS: 27 %
Lymphocytes Absolute: 2.2 10*3/uL (ref 0.7–3.1)
MCH: 24.6 pg — ABNORMAL LOW (ref 26.6–33.0)
MCHC: 31.5 g/dL (ref 31.5–35.7)
MCV: 78 fL — AB (ref 79–97)
Monocytes Absolute: 0.7 10*3/uL (ref 0.1–0.9)
Monocytes: 8 %
NEUTROS PCT: 63 %
Neutrophils Absolute: 5.2 10*3/uL (ref 1.4–7.0)
PLATELETS: 380 10*3/uL (ref 150–450)
RBC: 4.03 x10E6/uL (ref 3.77–5.28)
RDW: 15.3 % (ref 12.3–15.4)
WBC: 8.2 10*3/uL (ref 3.4–10.8)

## 2017-12-14 LAB — HEMOGLOBIN A1C
Est. average glucose Bld gHb Est-mCnc: 166 mg/dL
Hgb A1c MFr Bld: 7.4 % — ABNORMAL HIGH (ref 4.8–5.6)

## 2017-12-14 NOTE — Progress Notes (Signed)
Subjective: Chief Complaint  Patient presents with  . med check    fasting med check    Is here with her family for fasting med check.    hyperlipidemia - taking Lipitor 40mg  daily and 81mg  Aspirin  Diabetes - taking Metformin 1000mg , 1.5 tablet morning, 1 evening, Glipizide 5mg  once daily.  hasn't been checking glucose, needs battery for her glucometer.  At times feels sweaty and weak, wonders about hypoglycemia..  No foot lesions.   HTN - taking Lisinopril HCT 20/25 mg daily.  No chest pain, no dyspnea.   Exercise - not much, although her husband who is a Education officer, environmentalpastor their church has a walking group 3 days a week.  She voiced that she has no good excuse for not participating  She declines vaccines in general   Past Medical History:  Diagnosis Date  . Diabetes mellitus without complication (HCC) 2004   possibly diagnosed 2004  . Family history of premature CAD    mother  . History of Doppler ultrasound    Carotid US 3/19: No significant ICA stenosis  . History of echocardiogram    Echo 3/19: mild LVH, EF 55-60, no RWMA, Gr 1 DD, mild LAE  . History of exercise stress test    GXT 3/19: Ex 4'; normal BP response; no ischemic ECG changes  . History of PCOS 2004   questionable  . Hyperlipidemia   . Hypertension 2000  . Neck deformity, acquired   . Obesity   . Wears glasses    Current Outpatient Medications on File Prior to Visit  Medication Sig Dispense Refill  . ASPIRIN LOW DOSE 81 MG EC tablet TK 1 T PO D  3  . atorvastatin (LIPITOR) 40 MG tablet TAKE 1 TABLET BY MOUTH DAILY 90 tablet 0  . B Complex-C (SUPER B COMPLEX PO) Take 1 tablet by mouth daily. Reported on 03/23/2015    . Calcium Carbonate-Vitamin D (CALTRATE 600+D) 600-400 MG-UNIT per chew tablet Chew 1 tablet by mouth daily. 90 tablet 3  . CRANBERRY PO Take 1 tablet by mouth daily. OTC SUPPLEMENT    . FLUoxetine (PROZAC) 20 MG capsule TAKE ONE CAPSULE BY MOUTH DAILY 90 capsule 0  . glipiZIDE (GLUCOTROL) 5 MG tablet  TAKE 1 TABLET(5 MG) BY MOUTH DAILY BEFORE BREAKFAST 30 tablet 0  . hydrOXYzine (ATARAX/VISTARIL) 10 MG tablet Take 1 tablet (10 mg total) by mouth 3 (three) times daily as needed. 90 tablet 2  . lisinopril-hydrochlorothiazide (PRINZIDE,ZESTORETIC) 20-25 MG tablet TAKE 1 TABLET BY MOUTH DAILY 90 tablet 0  . meclizine (ANTIVERT) 25 MG tablet Take 1 tablet (25 mg total) by mouth 2 (two) times daily. 30 tablet 0  . metFORMIN (GLUCOPHAGE) 1000 MG tablet TAKE 1 AND 1/2 TABLETS BY MOUTH IN THE MORNING AND 1 TABLET IN THE EVENING. 225 tablet 0  . Multiple Vitamins-Minerals (CENTRUM) tablet Take 1 tablet by mouth daily.      . ondansetron (ZOFRAN) 4 MG tablet Take 1 tablet (4 mg total) by mouth daily as needed for nausea or vomiting. 30 tablet 1  . ranitidine (ZANTAC) 300 MG tablet TAKE 1 TABLET BY MOUTH AT BEDTIME 90 tablet 0  . Zinc 50 MG TABS Take 1 tablet by mouth daily.       No current facility-administered medications on file prior to visit.    ROS as in subjective     Objective: BP 124/80   Pulse 90   Temp 98.1 F (36.7 C) (Oral)   Resp 16  Ht 5\' 5"  (1.651 m)   Wt 216 lb 12.8 oz (98.3 kg)   SpO2 99%   BMI 36.08 kg/m   Wt Readings from Last 3 Encounters:  12/14/17 216 lb 12.8 oz (98.3 kg)  06/27/17 219 lb 3.2 oz (99.4 kg)  04/03/17 220 lb 12.8 oz (100.2 kg)   General appearance: alert, no distress, WD/WN,  neck: supple, no lymphadenopathy, no thyromegaly, no masses Heart: RRR, normal S1, S2, no murmurs Lungs: CTA bilaterally, no wheezes, rhonchi, or rales ExT: no edema Pulses: 2+ symmetric, upper and lower extremities, normal cap refill      Assessment: Encounter Diagnoses  Name Primary?  . Type 2 diabetes mellitus with other specified complication, without long-term current use of insulin (HCC) Yes  . Influenza vaccination declined   . Pneumococcal vaccination declined   . Essential hypertension   . Hyperlipidemia, unspecified hyperlipidemia type   . Obesity with  serious comorbidity, unspecified classification, unspecified obesity type   . Vaccine counseling   . Screening for cancer   . Anemia, unspecified type      Plan: Diabetes type 2- recheck labs today, counseled on daily foot checks, yearly eye doctor visit, healthy eating habits, exercise, and advised she get her back to replaced so she can start taking her sugars again.  Counseled on avoiding hypoglycemia  Hypertension- at goal, continue current medication, stress test in March 2019 was normal  Hyperlipidemia-at goal per 01/2017 labs, continue statin as usual  I reviewed her cardiac visit, stress test and echocardiogram from earlier this year in March 2019.  Despite counseling on the benefits and need for vaccines, she declines pneumococcal and flu shot today  She is past due on Pap smear, advised she return soon for this  She had labs showing anemia back in January, recheck labs today.  She never brought back to stool cards.  Denies any heavy bleeding or bruising unusual bleeding or other.  Advised that we may need to end up pursuing colonoscopy depending on results  Obesity-continue efforts at weight loss through healthy lifestyle recommendations   Elaine Brown was seen today for med check.  Diagnoses and all orders for this visit:  Type 2 diabetes mellitus with other specified complication, without long-term current use of insulin (HCC) -     Hemoglobin A1c  Influenza vaccination declined  Pneumococcal vaccination declined  Essential hypertension  Hyperlipidemia, unspecified hyperlipidemia type  Obesity with serious comorbidity, unspecified classification, unspecified obesity type  Vaccine counseling  Screening for cancer  Anemia, unspecified type -     CBC with Differential/Platelet

## 2017-12-15 ENCOUNTER — Other Ambulatory Visit: Payer: Self-pay | Admitting: Medical

## 2017-12-15 ENCOUNTER — Telehealth: Payer: Self-pay | Admitting: Medical

## 2017-12-15 ENCOUNTER — Encounter: Payer: Self-pay | Admitting: Medical

## 2017-12-15 MED ORDER — ASPIRIN LOW DOSE 81 MG PO TBEC: DELAYED_RELEASE_TABLET | ORAL | 3 refills | Status: DC

## 2017-12-15 MED ORDER — LISINOPRIL-HYDROCHLOROTHIAZIDE 20-25 MG PO TABS: 1.0000 | ORAL_TABLET | Freq: Every day | ORAL | 3 refills | Status: AC

## 2017-12-15 MED ORDER — DAPAGLIFLOZIN PROPANEDIOL 5 MG PO TABS: 5.0000 mg | ORAL_TABLET | Freq: Every day | ORAL | 2 refills | Status: DC

## 2017-12-15 MED ORDER — ATORVASTATIN CALCIUM 40 MG PO TABS: 40.0000 mg | ORAL_TABLET | Freq: Every day | ORAL | 3 refills | Status: AC

## 2017-12-15 MED ORDER — FLUOXETINE HCL 20 MG PO CAPS: ORAL_CAPSULE | ORAL | 1 refills | Status: DC

## 2017-12-15 MED ORDER — METFORMIN HCL 1000 MG PO TABS: ORAL_TABLET | ORAL | 0 refills | Status: AC

## 2017-12-15 NOTE — Telephone Encounter (Signed)
P.A. FARXIGA 

## 2017-12-22 NOTE — Telephone Encounter (Signed)
Vernona RiegerLaura - see if drug reps can help us get approval.  This is for diabetes and heart disease benefits that only this drug has shown in studies unlike invokana and others.

## 2017-12-22 NOTE — Telephone Encounter (Signed)
P.A. Gibson Rampenied, pt needs trial of Invokana and Jardiance, do you want to switch?

## 2017-12-25 NOTE — Telephone Encounter (Signed)
Called Rep Kathlene NovemberMike and he suggested appeal and state this is the only drug indicated for primary prevention of heart failure and to print coupon card for free 30 day in the mean time which I did and called pharmacy and went thru for $0.

## 2017-12-27 ENCOUNTER — Encounter: Payer: Self-pay | Admitting: Medical

## 2018-01-01 NOTE — Telephone Encounter (Signed)
Appeal letter typed and faxed  

## 2018-01-02 ENCOUNTER — Ambulatory Visit: Payer: BLUE CROSS/BLUE SHIELD | Admitting: Podiatry

## 2018-01-04 ENCOUNTER — Telehealth: Payer: Self-pay | Admitting: Medical

## 2018-01-04 NOTE — Telephone Encounter (Signed)
If she still has glipizide left let us have her go back to that along with the metformin.  [facility].  If she currently having any vaginal symptoms?  If so let me know and I may need to call out something for yeast infection  Have her call back in 2 weeks otherwise and let me know how her sugars are running.  She certainly needs to make improved diet changes and get exercise as we discussed.  In 2 weeks depending on her response I may need to add something else on to help lower the sugars since she did not tolerate [place]

## 2018-01-04 NOTE — Telephone Encounter (Signed)
Pt called and state that she has been of new medication for almost 2 weeks. About a week ago she started experiencing nausea and itching and irritation in her private area. Please advise pt at [phone].

## 2018-01-05 NOTE — Telephone Encounter (Signed)
Left message on voicemail for patient to call back. 

## 2018-01-08 ENCOUNTER — Other Ambulatory Visit: Payer: Self-pay

## 2018-01-08 ENCOUNTER — Other Ambulatory Visit: Payer: Self-pay | Admitting: Medical

## 2018-01-08 NOTE — Telephone Encounter (Signed)
Patient stated she does not need anything to be called in for yeast, she thinks it is clearing up. Marcelline DeistFarxiga has been discontinued in the chart. Patient was informed to call the office if she needs anything.

## 2018-01-08 NOTE — Telephone Encounter (Signed)
Patient has been informed of providers note. Patient stated her vaginal symptoms has improved. She still has a small itch but nothing like before. Patient was informed to call and provide her sugar levels in 2 weeks.

## 2018-01-08 NOTE — Telephone Encounter (Signed)
Is this ok to refill?  

## 2018-01-08 NOTE — Telephone Encounter (Signed)
Does she need me to call on anything for yeast infection?  Please discontinue Elaine DeistFarxiga in the chart

## 2018-01-09 ENCOUNTER — Other Ambulatory Visit: Payer: Self-pay | Admitting: Medical

## 2018-01-09 NOTE — Telephone Encounter (Signed)
Called pt to see if she was still taking the Zantac. LVM  KH

## 2018-01-22 ENCOUNTER — Encounter: Payer: Self-pay | Admitting: Medical

## 2018-01-22 ENCOUNTER — Ambulatory Visit: Payer: BLUE CROSS/BLUE SHIELD | Admitting: Medical

## 2018-01-22 VITALS — BP 130/80 | HR 116 | Temp 98.6°F | Wt 220.2 lb

## 2018-01-22 DIAGNOSIS — D649 Anemia, unspecified: Secondary | ICD-10-CM

## 2018-01-22 DIAGNOSIS — R000 Tachycardia, unspecified: Secondary | ICD-10-CM | POA: Diagnosis not present

## 2018-01-22 DIAGNOSIS — J029 Acute pharyngitis, unspecified: Secondary | ICD-10-CM | POA: Diagnosis not present

## 2018-01-22 DIAGNOSIS — E1169 Type 2 diabetes mellitus with other specified complication: Secondary | ICD-10-CM | POA: Diagnosis not present

## 2018-01-22 LAB — POCT RAPID STREP A (OFFICE): Rapid Strep A Screen: NEGATIVE

## 2018-01-22 MED ORDER — AMOXICILLIN 500 MG PO CAPS: 500.0000 mg | ORAL_CAPSULE | Freq: Three times a day (TID) | ORAL | 0 refills | Status: DC

## 2018-01-22 MED ORDER — FERROUS GLUCONATE 324 (38 FE) MG PO TABS: 324.0000 mg | ORAL_TABLET | Freq: Every day | ORAL | 2 refills | Status: AC

## 2018-01-22 NOTE — Progress Notes (Signed)
8

## 2018-01-22 NOTE — Progress Notes (Signed)
Subjective:  Elaine Brown is a 47 y.o. female who presents for respiratory illness.  She notes sore throat and mild congestion started 5 days ago.  She notes some cough, right ear sensitivity.   No fever, no vomiting.  Has had some nausea.  Has some runny nose.   No significant headaches.   Sore throat and post nasal drainage is the worse.   Using mucinex DM for symptoms.  denies sick contacts.  Past history is significant for diabetes.   Patient is not a smoker.  No other aggravating or relieving factors.    Since last visit she tried [place] but had yeast infection  Within days of starting the medication.  She then stopped the medication.  She is compliant with her current medications metformin and glipizide.  She notes prior elevated pulse rate but she does not feel well today being sick with sore throat  She reports that she never heard back about the gastro referral given the anemia  No other c/o.  Past Medical History:  Diagnosis Date  . Diabetes mellitus without complication (HCC) 2004   possibly diagnosed 2004  . Family history of premature CAD    mother  . History of Doppler ultrasound    Carotid US 3/19: No significant ICA stenosis  . History of echocardiogram    Echo 3/19: mild LVH, EF 55-60, no RWMA, Gr 1 DD, mild LAE  . History of exercise stress test    GXT 3/19: Ex 4'; normal BP response; no ischemic ECG changes  . History of PCOS 2004   questionable  . Hyperlipidemia   . Hypertension 2000  . Neck deformity, acquired   . Obesity   . Wears glasses     ROS as in subjective   Objective: BP 130/80 (BP Location: Left Arm, Patient Position: Sitting)   Pulse (!) 116   Temp 98.6 F (37 C)   Wt 220 lb 3.2 oz (99.9 kg)   LMP 01/17/2018   SpO2 97%   BMI 36.64 kg/m   General appearance: Alert, WD/WN, no distress, mildly ill appearing                             Skin: warm, no rash                           Head: no sinus tenderness  Eyes: conjunctiva normal, corneas clear, PERRLA                            Ears: pearly TMs, external ear canals normal                          Nose: septum midline, turbinates swollen, with erythema and clear discharge             Mouth/throat: MMM, tongue normal, mild pharyngeal erythema                           Neck: supple, no adenopathy, no thyromegaly, non tender                          Heart: tachycardic, otherwise RRR, normal S1, S2, no murmurs  Lungs: CTA bilaterally, no wheezes, rales, or rhonchi       Assessment  Encounter Diagnoses  Name Primary?  . Sore throat Yes  . Tachycardia   . Anemia, unspecified type   . Type 2 diabetes mellitus with other specified complication, without long-term current use of insulin (HCC)       Plan: Sore throat-strep negative, discussed likely viral nature.  Discussed supportive care, rest, hydration, Mucinex DM, sore throat spray, saltwater gargles.  If worse in the next 48 hours or febrile then begin amoxicillin.  Tachycardia- likely due to illness today.  Reviewed prior EKG, and could also be caused by the anemia.  Monitor for now  Anemia-begin iron once daily, referral to gastroenterology.  Discussed possible causes and need to rule out certain causes.  Advised that she still needs to come in for the pelvic exam as she has not done to rule out pelvic issue and a referral to gastroenterology to rule out colon bleed somewhere  Diabetes-continue metformin and glipizide, return soon for diabetes follow-up as planned.  We will hold off on Farxiga or similar SGLT2 medication for now given possible yeast infection issues  Patient was advised to call or return if worse or not improving in the next few days.    Patient voiced understanding of diagnosis, recommendations, and treatment plan.  After visit summary given.   [name] was seen today for other.  Diagnoses and all orders for this visit:  Sore  throat  Tachycardia  Anemia, unspecified type -     Ambulatory referral to Gastroenterology  Type 2 diabetes mellitus with other specified complication, without long-term current use of insulin (HCC)  Other orders -     ferrous gluconate (FERGON) 324 MG tablet; Take 1 tablet (324 mg total) by mouth daily with breakfast. -     amoxicillin (AMOXIL) 500 MG capsule; Take 1 capsule (500 mg total) by mouth 3 (three) times daily.

## 2018-01-22 NOTE — Patient Instructions (Addendum)
Your current symptoms suggest a viral respiratory infection  Recommendations:  Drink plenty of water thought the day  Use salt water gargles 4 times daily  I recommend a mixture of hot tea, honey and lemon juice to sooth throat pain  I recommend Chloraseptic sore spray through the day  You can continue either Mucinex DM twice daily or you can use Benadryl over the counter at bedtime for the next 3-4 days  Use Ibuprofen OTC 3 tablet twice daily for the next few days short term  If not improving in the next 48 hours, you can begin Amoxicillin.    You have anemia which is low blood count.  It is not clear where this is coming from.  Begin ferrous gluconate iron daily  We will refer you to gastroenterology to investigate this further  You still need a pelvic exam to rule out any type of pelvic mass or concern for bleeding  You can return for this at your convenience or we can follow-up plan for this on the next med check visit with diabetes if you let them know when you call in  Elevated pulse rate  This could be partly related to the anemia  We will continue to monitor this but if your pulse rate is high on the next visit we may need to consider medication to help control this

## 2018-02-05 ENCOUNTER — Other Ambulatory Visit: Payer: Self-pay | Admitting: Medical

## 2018-02-21 NOTE — Telephone Encounter (Signed)
Appeal was denied for [place], pt must try Invokana and Jardiance.  Marcelline Deist was already discontinued

## 2018-03-08 ENCOUNTER — Encounter: Payer: BLUE CROSS/BLUE SHIELD | Admitting: Medical

## 2018-03-13 DIAGNOSIS — E1169 Type 2 diabetes mellitus with other specified complication: Secondary | ICD-10-CM | POA: Diagnosis not present

## 2018-03-13 DIAGNOSIS — M7661 Achilles tendinitis, right leg: Secondary | ICD-10-CM | POA: Diagnosis not present

## 2018-03-13 DIAGNOSIS — Z0000 Encounter for general adult medical examination without abnormal findings: Secondary | ICD-10-CM | POA: Diagnosis not present

## 2018-03-13 DIAGNOSIS — I10 Essential (primary) hypertension: Secondary | ICD-10-CM | POA: Diagnosis not present

## 2018-03-13 DIAGNOSIS — Z1239 Encounter for other screening for malignant neoplasm of breast: Secondary | ICD-10-CM | POA: Diagnosis not present

## 2018-03-13 DIAGNOSIS — E782 Mixed hyperlipidemia: Secondary | ICD-10-CM | POA: Diagnosis not present

## 2018-03-26 DIAGNOSIS — E1169 Type 2 diabetes mellitus with other specified complication: Secondary | ICD-10-CM | POA: Diagnosis not present

## 2018-03-26 DIAGNOSIS — D649 Anemia, unspecified: Secondary | ICD-10-CM | POA: Diagnosis not present

## 2018-03-26 DIAGNOSIS — M722 Plantar fascial fibromatosis: Secondary | ICD-10-CM | POA: Diagnosis not present

## 2018-03-26 DIAGNOSIS — M7661 Achilles tendinitis, right leg: Secondary | ICD-10-CM | POA: Diagnosis not present

## 2018-03-26 DIAGNOSIS — Z0000 Encounter for general adult medical examination without abnormal findings: Secondary | ICD-10-CM | POA: Diagnosis not present

## 2018-03-26 DIAGNOSIS — M7662 Achilles tendinitis, left leg: Secondary | ICD-10-CM | POA: Diagnosis not present

## 2018-03-28 DIAGNOSIS — Z0000 Encounter for general adult medical examination without abnormal findings: Secondary | ICD-10-CM | POA: Diagnosis not present

## 2018-03-28 DIAGNOSIS — Z9851 Tubal ligation status: Secondary | ICD-10-CM | POA: Diagnosis not present

## 2018-03-28 DIAGNOSIS — Z01419 Encounter for gynecological examination (general) (routine) without abnormal findings: Secondary | ICD-10-CM | POA: Diagnosis not present

## 2018-03-28 DIAGNOSIS — R238 Other skin changes: Secondary | ICD-10-CM | POA: Diagnosis not present

## 2018-03-28 DIAGNOSIS — R875 Abnormal microbiological findings in specimens from female genital organs: Secondary | ICD-10-CM | POA: Diagnosis not present

## 2018-03-28 DIAGNOSIS — Z6838 Body mass index (BMI) 38.0-38.9, adult: Secondary | ICD-10-CM | POA: Diagnosis not present

## 2018-04-03 DIAGNOSIS — Z1239 Encounter for other screening for malignant neoplasm of breast: Secondary | ICD-10-CM | POA: Diagnosis not present

## 2018-04-03 DIAGNOSIS — Z1231 Encounter for screening mammogram for malignant neoplasm of breast: Secondary | ICD-10-CM | POA: Diagnosis not present

## 2018-09-18 DIAGNOSIS — I10 Essential (primary) hypertension: Secondary | ICD-10-CM | POA: Diagnosis not present

## 2018-09-18 DIAGNOSIS — K219 Gastro-esophageal reflux disease without esophagitis: Secondary | ICD-10-CM | POA: Insufficient documentation

## 2018-09-18 DIAGNOSIS — E782 Mixed hyperlipidemia: Secondary | ICD-10-CM | POA: Diagnosis not present

## 2018-09-18 DIAGNOSIS — E6609 Other obesity due to excess calories: Secondary | ICD-10-CM | POA: Diagnosis not present

## 2018-09-18 DIAGNOSIS — E1169 Type 2 diabetes mellitus with other specified complication: Secondary | ICD-10-CM | POA: Diagnosis not present

## 2018-09-19 DIAGNOSIS — E782 Mixed hyperlipidemia: Secondary | ICD-10-CM | POA: Diagnosis not present

## 2018-09-19 DIAGNOSIS — I10 Essential (primary) hypertension: Secondary | ICD-10-CM | POA: Diagnosis not present

## 2018-09-19 DIAGNOSIS — E1169 Type 2 diabetes mellitus with other specified complication: Secondary | ICD-10-CM | POA: Diagnosis not present

## 2018-09-19 DIAGNOSIS — D649 Anemia, unspecified: Secondary | ICD-10-CM | POA: Diagnosis not present

## 2018-11-01 DIAGNOSIS — H2513 Age-related nuclear cataract, bilateral: Secondary | ICD-10-CM | POA: Diagnosis not present

## 2018-11-01 DIAGNOSIS — H52203 Unspecified astigmatism, bilateral: Secondary | ICD-10-CM | POA: Diagnosis not present

## 2018-11-01 DIAGNOSIS — E119 Type 2 diabetes mellitus without complications: Secondary | ICD-10-CM | POA: Diagnosis not present

## 2018-12-13 DIAGNOSIS — H9209 Otalgia, unspecified ear: Secondary | ICD-10-CM | POA: Diagnosis not present

## 2018-12-13 DIAGNOSIS — R05 Cough: Secondary | ICD-10-CM | POA: Diagnosis not present

## 2018-12-13 DIAGNOSIS — R52 Pain, unspecified: Secondary | ICD-10-CM | POA: Diagnosis not present

## 2018-12-13 DIAGNOSIS — U071 COVID-19: Secondary | ICD-10-CM | POA: Diagnosis not present

## 2018-12-19 DIAGNOSIS — I10 Essential (primary) hypertension: Secondary | ICD-10-CM | POA: Diagnosis not present

## 2018-12-19 DIAGNOSIS — K219 Gastro-esophageal reflux disease without esophagitis: Secondary | ICD-10-CM | POA: Diagnosis not present

## 2018-12-19 DIAGNOSIS — E1169 Type 2 diabetes mellitus with other specified complication: Secondary | ICD-10-CM | POA: Diagnosis not present

## 2018-12-19 DIAGNOSIS — E782 Mixed hyperlipidemia: Secondary | ICD-10-CM | POA: Diagnosis not present

## 2018-12-24 DIAGNOSIS — Z20828 Contact with and (suspected) exposure to other viral communicable diseases: Secondary | ICD-10-CM | POA: Diagnosis not present

## 2019-01-11 DIAGNOSIS — E782 Mixed hyperlipidemia: Secondary | ICD-10-CM | POA: Diagnosis not present

## 2019-01-11 DIAGNOSIS — I10 Essential (primary) hypertension: Secondary | ICD-10-CM | POA: Diagnosis not present

## 2019-01-11 DIAGNOSIS — E1169 Type 2 diabetes mellitus with other specified complication: Secondary | ICD-10-CM | POA: Diagnosis not present

## 2019-05-23 IMAGING — MG DIGITAL SCREENING BILATERAL MAMMOGRAM WITH CAD
4 series · 4 of 4 positions shown · non-contrast
Comparison: None.

CLINICAL DATA: Screening.

EXAM:
DIGITAL SCREENING BILATERAL MAMMOGRAM WITH CAD

[L MLO]
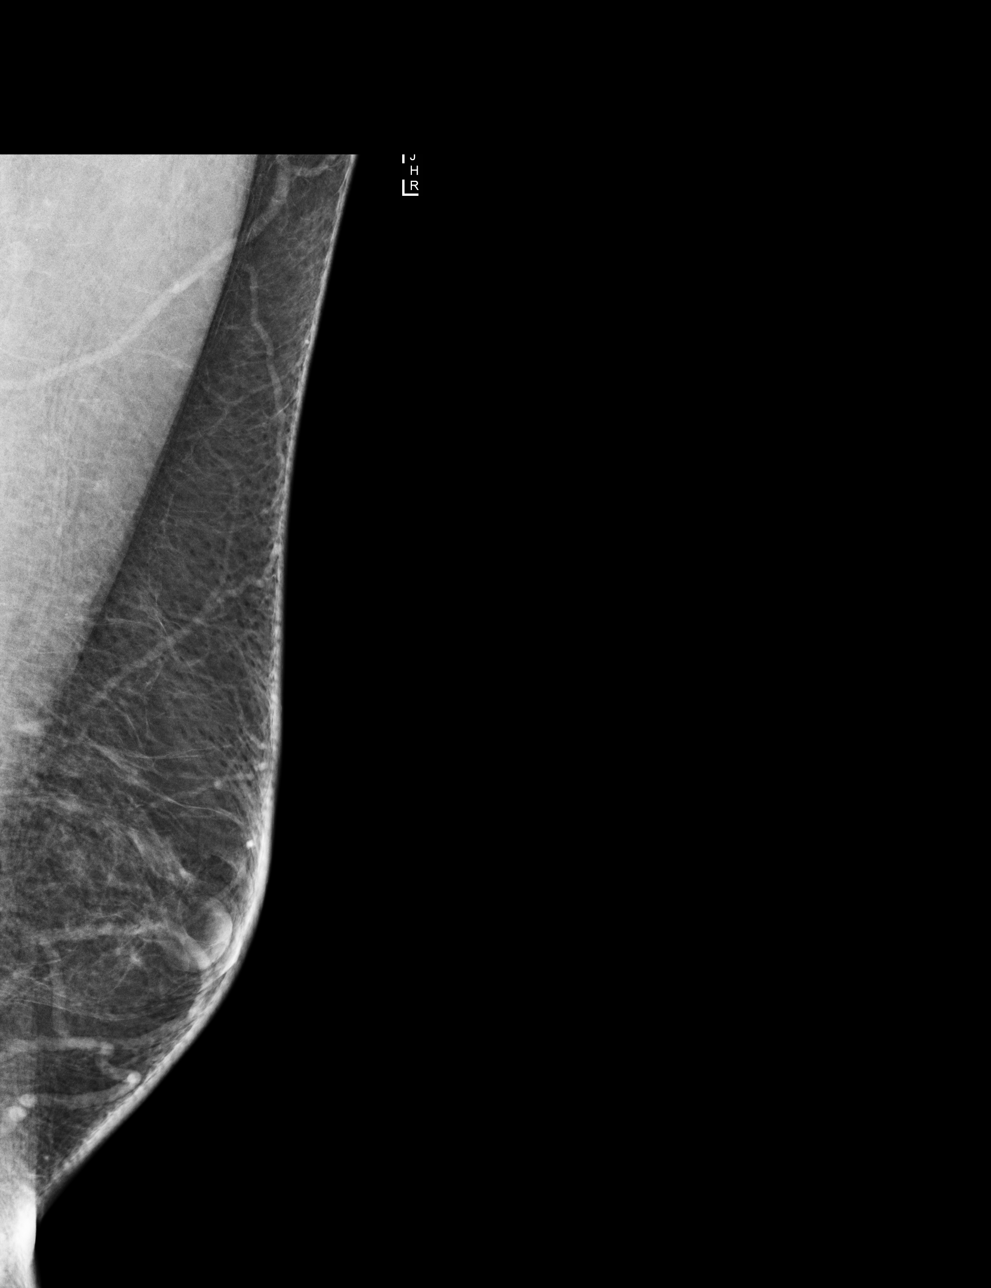

[R CC]
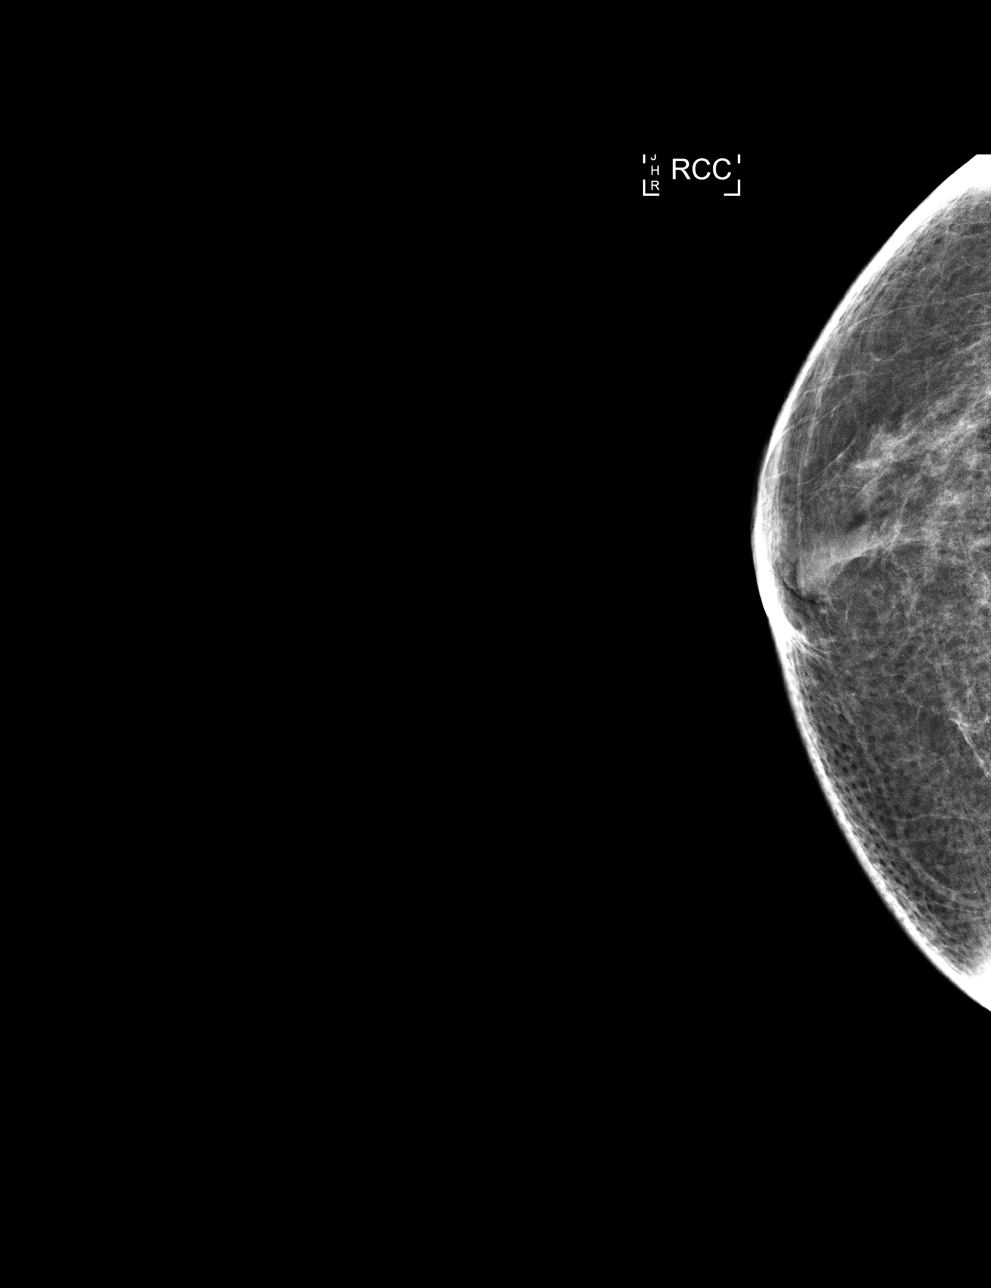

[L CC]
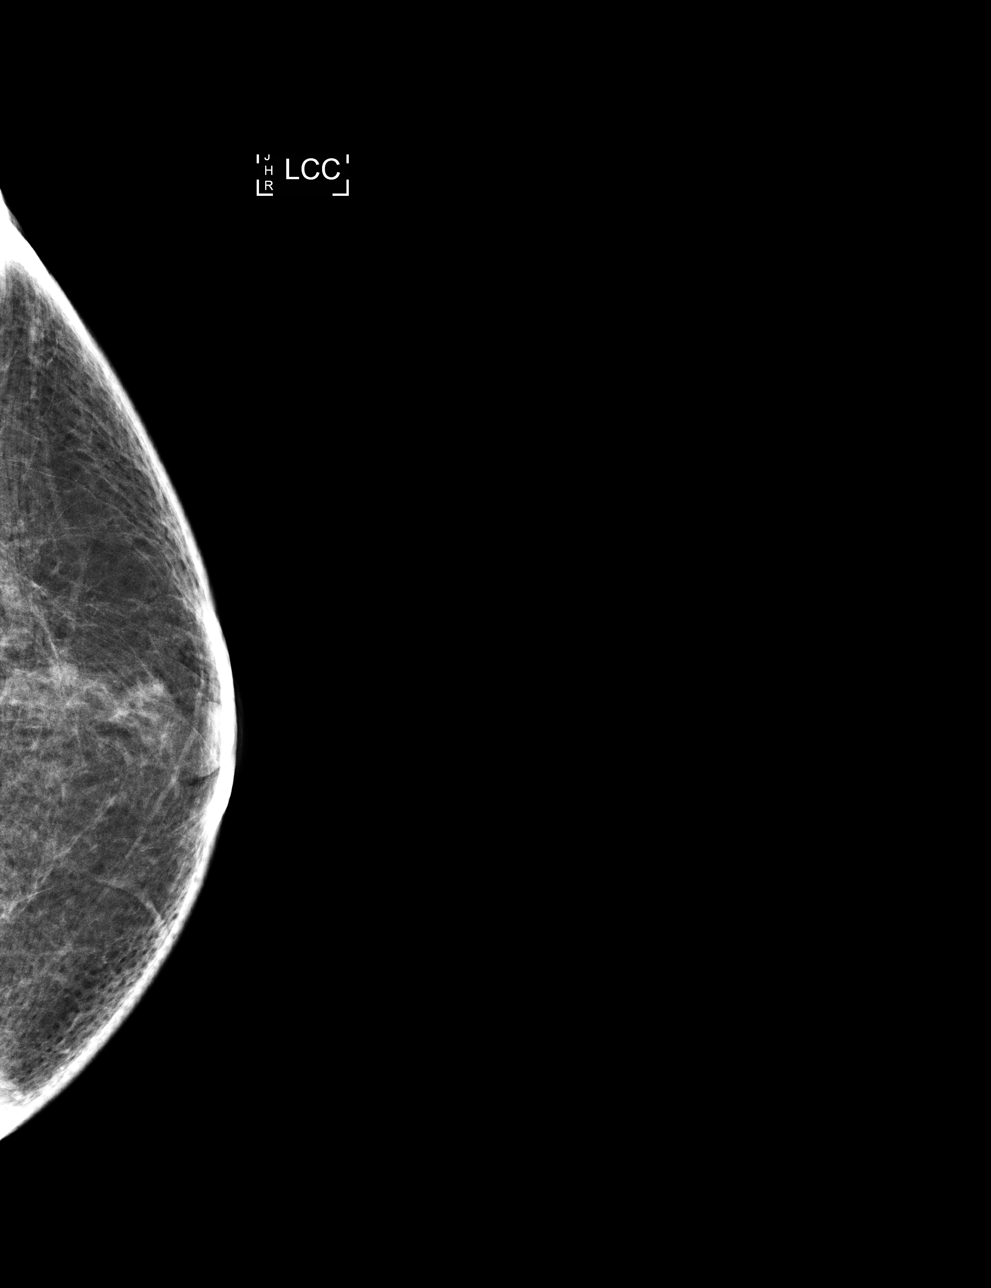

[R MLO]
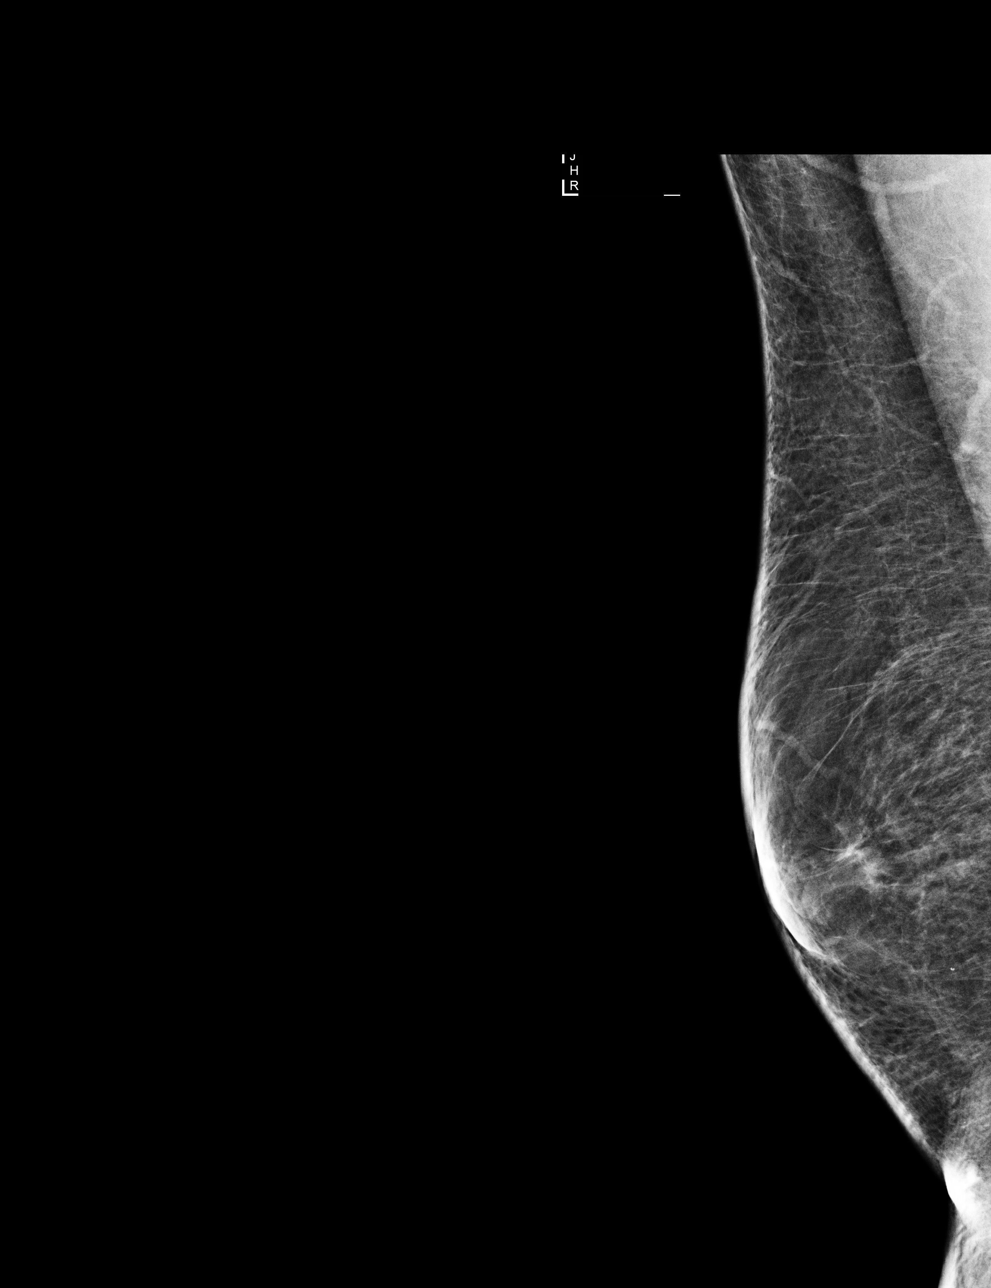

[4 of 4 positions shown; findings below may reference images not displayed]

ACR Breast Density Category b: There are scattered areas of
fibroglandular density.
FINDINGS: There are no findings suspicious for malignancy. Images were
processed with CAD.
IMPRESSION: No mammographic evidence of malignancy. A result letter of this
screening mammogram will be mailed directly to the patient.

RECOMMENDATION:
Screening mammogram in one year. (Code:[id number])

BI-RADS CATEGORY  1: Negative.

## 2019-07-29 DIAGNOSIS — R251 Tremor, unspecified: Secondary | ICD-10-CM | POA: Insufficient documentation

## 2020-09-29 DIAGNOSIS — F419 Anxiety disorder, unspecified: Secondary | ICD-10-CM | POA: Insufficient documentation

## 2022-06-09 ENCOUNTER — Ambulatory Visit (INDEPENDENT_AMBULATORY_CARE_PROVIDER_SITE_OTHER): Payer: Medicaid Other | Admitting: Podiatry

## 2022-06-09 ENCOUNTER — Ambulatory Visit (INDEPENDENT_AMBULATORY_CARE_PROVIDER_SITE_OTHER): Payer: Medicaid Other

## 2022-06-09 DIAGNOSIS — M2041 Other hammer toe(s) (acquired), right foot: Secondary | ICD-10-CM | POA: Diagnosis not present

## 2022-06-09 DIAGNOSIS — D2372 Other benign neoplasm of skin of left lower limb, including hip: Secondary | ICD-10-CM

## 2022-06-09 DIAGNOSIS — E1142 Type 2 diabetes mellitus with diabetic polyneuropathy: Secondary | ICD-10-CM

## 2022-06-09 DIAGNOSIS — M2040 Other hammer toe(s) (acquired), unspecified foot: Secondary | ICD-10-CM | POA: Diagnosis not present

## 2022-06-09 NOTE — Progress Notes (Signed)
Subjective:  Patient ID: Elaine Brown, female    DOB: [birth year],  MRN: [id number] HPI Chief Complaint  Patient presents with   Diabetes    Patient came in today for diabetic foot care, A1c- 6.2 [id number],  Right hallux had and infection in feb treated with bactrim, still having shooting pain, rate of pain 4 out of 10, oX-Rays taken today     52 y.o. female presents with the above complaint.   ROS: Denies fever chills nausea vomit muscle aches pains calf pain back pain chest pain shortness of breath.  Patient  Past Medical History:  Diagnosis Date   Diabetes mellitus without complication (HCC) 2004   possibly diagnosed 2004   Family history of premature CAD    mother   History of Doppler ultrasound    Carotid US 3/19: No significant ICA stenosis   History of echocardiogram    Echo 3/19: mild LVH, EF 55-60, no RWMA, Gr 1 DD, mild LAE   History of exercise stress test    GXT 3/19: Ex 4'; normal BP response; no ischemic ECG changes   History of PCOS 2004   questionable   Hyperlipidemia    Hypertension 2000   Neck deformity, acquired    Obesity    Wears glasses    Past Surgical History:  Procedure Laterality Date   BREAST LUMPECTOMY     left   CESAREAN SECTION     MOUTH SURGERY  1989   TUBAL LIGATION      Current Outpatient Medications:    amoxicillin (AMOXIL) 500 MG capsule, Take 1 capsule (500 mg total) by mouth 3 (three) times daily., Disp: 30 capsule, Rfl: 0   aspirin (ASPIRIN LOW DOSE) 81 MG EC tablet, TAKE 1 TABLET BY MOUTH DAILY, Disp: 90 tablet, Rfl: 0   atorvastatin (LIPITOR) 40 MG tablet, Take 1 tablet (40 mg total) by mouth daily., Disp: 90 tablet, Rfl: 3   B Complex-C (SUPER B COMPLEX PO), Take 1 tablet by mouth daily. Reported on 03/23/2015, Disp: , Rfl:    Calcium Carbonate-Vitamin D (CALTRATE 600+D) 600-400 MG-UNIT per chew tablet, Chew 1 tablet by mouth daily., Disp: 90 tablet, Rfl: 3   CRANBERRY PO, Take 1 tablet by mouth daily. OTC SUPPLEMENT, Disp: , Rfl:     ferrous gluconate (FERGON) 324 MG tablet, Take 1 tablet (324 mg total) by mouth daily with breakfast., Disp: 30 tablet, Rfl: 2   FLUoxetine (PROZAC) 20 MG capsule, TAKE ONE CAPSULE BY MOUTH DAILY, Disp: 90 capsule, Rfl: 1   glipiZIDE (GLUCOTROL) 5 MG tablet, TAKE 1 TABLET(5 MG) BY MOUTH DAILY BEFORE BREAKFAST, Disp: 90 tablet, Rfl: 0   hydrOXYzine (ATARAX/VISTARIL) 10 MG tablet, Take 1 tablet (10 mg total) by mouth 3 (three) times daily as needed., Disp: 90 tablet, Rfl: 2   lisinopril-hydrochlorothiazide (PRINZIDE,ZESTORETIC) 20-25 MG tablet, Take 1 tablet by mouth daily., Disp: 90 tablet, Rfl: 3   meclizine (ANTIVERT) 25 MG tablet, Take 1 tablet (25 mg total) by mouth 2 (two) times daily., Disp: 30 tablet, Rfl: 0   metFORMIN (GLUCOPHAGE) 1000 MG tablet, TAKE 1 AND 1/2 TABLETS BY MOUTH IN THE MORNING AND 1 TABLET IN THE EVENING., Disp: 225 tablet, Rfl: 0   Multiple Vitamins-Minerals (CENTRUM) tablet, Take 1 tablet by mouth daily.  , Disp: , Rfl:    vitamin C (ASCORBIC ACID) 500 MG tablet, Take 500 mg by mouth daily., Disp: , Rfl:    Zinc 50 MG TABS, Take 1 tablet by mouth daily.  ,  Disp: , Rfl:   No Known Allergies Review of Systems Objective:  There were no vitals filed for this visit.  General: Well developed, nourished, in no acute distress, alert and oriented x3   Dermatological: Skin is warm, dry and supple bilateral. Nails x 10 are thick yellow dystrophic onychomycotic.  Remaining integument appears unremarkable at this time. There are no open sores, no preulcerative lesions, no rash or signs of infection present.  Distal clavus preulcerative lesion no erythema cellulitis drainage odor distal aspect second digit left foot.    Vascular: Dorsalis Pedis artery and Posterior Tibial artery pedal pulses are 2/4 bilateral with immedate capillary fill time. Pedal hair growth present. No varicosities and no lower extremity edema present bilateral.   Neruologic: Grossly intact via light touch  bilateral. Vibratory intact via tuning fork bilateral. Protective threshold with Semmes Wienstein monofilament intact to all pedal sites bilateral. Patellar and Achilles deep tendon reflexes 2+ bilateral. No Babinski or clonus noted bilateral.   Musculoskeletal: No gross boney pedal deformities bilateral. No pain, crepitus, or limitation noted with foot and ankle range of motion bilateral. Muscular strength 5/5 in all groups tested bilateral.  Hammertoe deformities bilateral rigid second left osteoarthritic change.  Gait: Unassisted, Nonantalgic.    Radiographs:  Radiographs taken today demonstrate osseously mature individual significant osteoarthritic changes forefoot midfoot bilateral.  Mild osteopenia.  Assessment & Plan:   Assessment: Diabetes mellitus with diabetic peripheral neuropathy hammertoe deformities osteoarthritic changes midfoot forefoot bilateral.  Preulcerative lesions distal aspect second digit left  Plan: Debrided toenails 1 through 5 bilateral debrided preulcerative lesion left.  Discussed appropriate shoe gear stretching exercise ice therapy sugar modifications also dispensed silicone toe caps.     Elaine Brown T. [place], [place]

## 2022-07-01 ENCOUNTER — Other Ambulatory Visit: Payer: Self-pay | Admitting: Podiatry

## 2022-07-01 DIAGNOSIS — E1142 Type 2 diabetes mellitus with diabetic polyneuropathy: Secondary | ICD-10-CM

## 2022-07-01 DIAGNOSIS — D2372 Other benign neoplasm of skin of left lower limb, including hip: Secondary | ICD-10-CM

## 2022-07-01 DIAGNOSIS — M2040 Other hammer toe(s) (acquired), unspecified foot: Secondary | ICD-10-CM

## 2022-09-13 ENCOUNTER — Ambulatory Visit (INDEPENDENT_AMBULATORY_CARE_PROVIDER_SITE_OTHER): Payer: Medicaid Other | Admitting: Podiatrist

## 2022-09-13 ENCOUNTER — Encounter: Payer: Self-pay | Admitting: Podiatrist

## 2022-09-13 DIAGNOSIS — M79676 Pain in unspecified toe(s): Secondary | ICD-10-CM | POA: Diagnosis not present

## 2022-09-13 DIAGNOSIS — M79609 Pain in unspecified limb: Secondary | ICD-10-CM

## 2022-09-13 DIAGNOSIS — B351 Tinea unguium: Secondary | ICD-10-CM | POA: Diagnosis not present

## 2022-09-13 DIAGNOSIS — E1142 Type 2 diabetes mellitus with diabetic polyneuropathy: Secondary | ICD-10-CM | POA: Diagnosis not present

## 2022-09-13 NOTE — Progress Notes (Signed)
Subjective:  Patient ID: Elaine Brown, female    DOB: 1970/10/26,  MRN: [id number] Diabetes   Chief Complaint  Patient presents with   Diabetes    Patient is here for a follow up for [facility] No current pain or severe issue   Callouses    Bilateral great toe callous   Nail Problem    RNC    52 y.o. female presents with the above complaint.   ROS: Denies fever chills nausea vomit muscle aches pains calf pain back pain chest pain shortness of breath.  Patient  Past Medical History:  Diagnosis Date   Diabetes mellitus without complication (HCC) 2004   possibly diagnosed 2004   Family history of premature CAD    mother   History of Doppler ultrasound    Carotid US 3/19: No significant ICA stenosis   History of echocardiogram    Echo 3/19: mild LVH, EF 55-60, no RWMA, Gr 1 DD, mild LAE   History of exercise stress test    GXT 3/19: Ex 4'; normal BP response; no ischemic ECG changes   History of PCOS 2004   questionable   Hyperlipidemia    Hypertension 2000   Neck deformity, acquired    Obesity    Wears glasses    Past Surgical History:  Procedure Laterality Date   BREAST LUMPECTOMY     left   CESAREAN SECTION     MOUTH SURGERY  1989   TUBAL LIGATION      Current Outpatient Medications:    amoxicillin (AMOXIL) 500 MG capsule, Take 1 capsule (500 mg total) by mouth 3 (three) times daily., Disp: 30 capsule, Rfl: 0   aspirin (ASPIRIN LOW DOSE) 81 MG EC tablet, TAKE 1 TABLET BY MOUTH DAILY, Disp: 90 tablet, Rfl: 0   atorvastatin (LIPITOR) 40 MG tablet, Take 1 tablet (40 mg total) by mouth daily., Disp: 90 tablet, Rfl: 3   B Complex-C (SUPER B COMPLEX PO), Take 1 tablet by mouth daily. Reported on 03/23/2015, Disp: , Rfl:    Calcium Carbonate-Vitamin D (CALTRATE 600+D) 600-400 MG-UNIT per chew tablet, Chew 1 tablet by mouth daily., Disp: 90 tablet, Rfl: 3   CRANBERRY PO, Take 1 tablet by mouth daily. OTC SUPPLEMENT, Disp: , Rfl:    ferrous gluconate (FERGON) 324 MG tablet,  Take 1 tablet (324 mg total) by mouth daily with breakfast., Disp: 30 tablet, Rfl: 2   FLUoxetine (PROZAC) 20 MG capsule, TAKE ONE CAPSULE BY MOUTH DAILY, Disp: 90 capsule, Rfl: 1   glipiZIDE (GLUCOTROL) 5 MG tablet, TAKE 1 TABLET(5 MG) BY MOUTH DAILY BEFORE BREAKFAST, Disp: 90 tablet, Rfl: 0   hydrOXYzine (ATARAX/VISTARIL) 10 MG tablet, Take 1 tablet (10 mg total) by mouth 3 (three) times daily as needed., Disp: 90 tablet, Rfl: 2   lisinopril-hydrochlorothiazide (PRINZIDE,ZESTORETIC) 20-25 MG tablet, Take 1 tablet by mouth daily., Disp: 90 tablet, Rfl: 3   meclizine (ANTIVERT) 25 MG tablet, Take 1 tablet (25 mg total) by mouth 2 (two) times daily., Disp: 30 tablet, Rfl: 0   metFORMIN (GLUCOPHAGE) 1000 MG tablet, TAKE 1 AND 1/2 TABLETS BY MOUTH IN THE MORNING AND 1 TABLET IN THE EVENING., Disp: 225 tablet, Rfl: 0   Multiple Vitamins-Minerals (CENTRUM) tablet, Take 1 tablet by mouth daily.  , Disp: , Rfl:    vitamin C (ASCORBIC ACID) 500 MG tablet, Take 500 mg by mouth daily., Disp: , Rfl:    Zinc 50 MG TABS, Take 1 tablet by mouth daily.  , Disp: ,  Rfl:   No Known Allergies Review of Systems Objective:  There were no vitals filed for this visit.  General: Well developed, nourished, in no acute distress, alert and oriented x3   Dermatological: Skin is warm, dry and supple bilateral. Nails x 10 are thick yellow dystrophic onychomycotic.  Remaining integument appears unremarkable at this time. There are no open sores, no preulcerative lesions, no rash or signs of infection present.  Distal clavus preulcerative lesion resolved left second toe.    Vascular: Dorsalis Pedis artery and Posterior Tibial artery pedal pulses are 2/4 bilateral with immedate capillary fill time. Pedal hair growth present. No varicosities and no lower extremity edema present bilateral.   Neruologic: Grossly intact via light touch bilateral. Vibratory intact via tuning fork bilateral. Protective threshold with Semmes  Wienstein monofilament intact to all pedal sites bilateral. Patellar and Achilles deep tendon reflexes 2+ bilateral. No Babinski or clonus noted bilateral.   Musculoskeletal: No gross boney pedal deformities bilateral. No pain, crepitus, or limitation noted with foot and ankle range of motion bilateral. Muscular strength 5/5 in all groups tested bilateral.  Hammertoe deformities bilateral rigid second left osteoarthritic change.  Gait: Unassisted, Nonantalgic.    Assessment & Plan:   Assessment:    ICD-10-CM   1. Diabetic peripheral neuropathy (HCC)  E11.42     2. Pain due to onychomycosis of nail  B35.1    M79.609       Plan: Debrided toenails 1 through 5 bilateral debrided preulcerative lesion left.  Discussed appropriate shoe gear and recommended continued use of toe caps when needed.  Return appointment recommended in 3 months.

## 2022-09-13 NOTE — Patient Instructions (Signed)
Flexitol heel balm.  For rough skin-  file with a file after a week or so of use.   Son-  eucerin in a tub-  apply twice daily  There are moisturinzing socks that also work well-  you can find them at [facility] supply-  sometimes [facility] them.

## 2022-12-13 ENCOUNTER — Encounter: Payer: Self-pay | Admitting: Podiatry

## 2022-12-13 ENCOUNTER — Ambulatory Visit (INDEPENDENT_AMBULATORY_CARE_PROVIDER_SITE_OTHER): Payer: Medicaid Other | Admitting: Podiatry

## 2022-12-13 DIAGNOSIS — E1142 Type 2 diabetes mellitus with diabetic polyneuropathy: Secondary | ICD-10-CM

## 2022-12-13 DIAGNOSIS — M79609 Pain in unspecified limb: Secondary | ICD-10-CM

## 2022-12-13 DIAGNOSIS — M79676 Pain in unspecified toe(s): Secondary | ICD-10-CM

## 2022-12-13 DIAGNOSIS — B351 Tinea unguium: Secondary | ICD-10-CM | POA: Diagnosis not present

## 2022-12-13 NOTE — Progress Notes (Signed)
She presents today chief complaint of painful elongated toenails.  Objective: Toenails are long thick yellow dystrophic clinically mycotic painful palpation.  Pulses are palpable.  Assessment: Pain limb secondary to onychomycosis.  Plan: Debridement toenails 1 through 5 bilateral.

## 2023-03-16 ENCOUNTER — Ambulatory Visit: Payer: Medicaid Other | Admitting: Podiatry

## 2023-03-22 LAB — COLOGUARD: COLOGUARD: POSITIVE — AB

## 2023-03-23 ENCOUNTER — Ambulatory Visit: Payer: Medicaid Other | Admitting: Podiatry

## 2023-03-28 ENCOUNTER — Ambulatory Visit: Payer: Medicaid Other | Admitting: Podiatry

## 2023-04-25 ENCOUNTER — Ambulatory Visit (INDEPENDENT_AMBULATORY_CARE_PROVIDER_SITE_OTHER): Payer: Medicaid Other | Admitting: Podiatry

## 2023-04-25 ENCOUNTER — Encounter: Payer: Self-pay | Admitting: Podiatry

## 2023-04-25 DIAGNOSIS — B351 Tinea unguium: Secondary | ICD-10-CM

## 2023-04-25 DIAGNOSIS — D2372 Other benign neoplasm of skin of left lower limb, including hip: Secondary | ICD-10-CM | POA: Diagnosis not present

## 2023-04-25 DIAGNOSIS — M79609 Pain in unspecified limb: Secondary | ICD-10-CM

## 2023-04-25 DIAGNOSIS — M79676 Pain in unspecified toe(s): Secondary | ICD-10-CM

## 2023-04-25 DIAGNOSIS — E1142 Type 2 diabetes mellitus with diabetic polyneuropathy: Secondary | ICD-10-CM | POA: Diagnosis not present

## 2023-04-25 NOTE — Progress Notes (Signed)
 She presents today chief complaint of painful elongated toenails.  Objective: Pulses are palpable bilateral good digital hair growth is noted bilateral.  Toenails are long thick yellow dystrophic onychomycotic sharply incurvated.  Assessment: Pain limb secondary onychomycosis.  Peripheral neuropathy bilateral.  Plan: Debridement of toenails 1 through 5 bilateral.

## 2023-08-01 ENCOUNTER — Ambulatory Visit: Admitting: Podiatry

## 2023-08-17 ENCOUNTER — Encounter: Payer: Self-pay | Admitting: Podiatry

## 2023-08-17 ENCOUNTER — Ambulatory Visit: Admitting: Podiatry

## 2023-08-17 DIAGNOSIS — D2372 Other benign neoplasm of skin of left lower limb, including hip: Secondary | ICD-10-CM

## 2023-08-17 DIAGNOSIS — M79609 Pain in unspecified limb: Secondary | ICD-10-CM

## 2023-08-17 DIAGNOSIS — B351 Tinea unguium: Secondary | ICD-10-CM

## 2023-08-17 DIAGNOSIS — E1142 Type 2 diabetes mellitus with diabetic polyneuropathy: Secondary | ICD-10-CM

## 2023-08-17 DIAGNOSIS — M2040 Other hammer toe(s) (acquired), unspecified foot: Secondary | ICD-10-CM

## 2023-08-20 NOTE — Progress Notes (Signed)
 She presents today for her diabetic checkup and to have her nails debrided.  Objective: Vital signs are stable oriented x 3.  Pulses are palpable.  Neurologic sensorium is intact per Semmes Weinstein monofilament deep tendon reflexes are intact.  Muscle strength was 5/5 in dorsiflexors plantar flexors inverters everters are intrinsic musculature is intact she has good extension of the leg at the knee.  No open lesions or wounds.  Toenails are thick yellow dystrophic possibly mycotic.  Assessment: Diabetes mellitus with subjective neuropathy.  Onychomycosis.  Plan: Debridement of nails 1 through 5 bilateral.

## 2023-11-16 ENCOUNTER — Ambulatory Visit (INDEPENDENT_AMBULATORY_CARE_PROVIDER_SITE_OTHER): Admitting: Podiatry

## 2023-11-16 DIAGNOSIS — B351 Tinea unguium: Secondary | ICD-10-CM

## 2023-11-16 DIAGNOSIS — D2372 Other benign neoplasm of skin of left lower limb, including hip: Secondary | ICD-10-CM | POA: Diagnosis not present

## 2023-11-16 DIAGNOSIS — E1142 Type 2 diabetes mellitus with diabetic polyneuropathy: Secondary | ICD-10-CM

## 2023-11-16 DIAGNOSIS — M79609 Pain in unspecified limb: Secondary | ICD-10-CM | POA: Diagnosis not present

## 2023-11-16 NOTE — Progress Notes (Signed)
 Presents today complaining of painful elongated toenails and a preulcerative lesion left foot.  States that her blood sugars doing better she has a besides recent.  Objective: Vital signs are stable she is alert and oriented x 3.  Pulses are palpable.  Hammertoe deformities bilateral distal clavus preulcerative lesion second digit left foot skin is not broken down.  Benign skin lesion diagnosed second toe left.  Otherwise toenails are long thick yellow dystrophic onychomycotic.  Assessment: Diabetes mellitus with diabetic peripheral neuropathy hammertoe deformity preulcerative lesion second digit left foot and painful mycotic nails.  Plan: Debrided benign skin lesion placed buttress pad and debrided toenails 1 through 5 bilateral

## 2024-01-04 ENCOUNTER — Ambulatory Visit: Admitting: Podiatry

## 2024-01-04 ENCOUNTER — Encounter: Payer: Self-pay | Admitting: Podiatry

## 2024-01-04 DIAGNOSIS — E1142 Type 2 diabetes mellitus with diabetic polyneuropathy: Secondary | ICD-10-CM

## 2024-01-04 DIAGNOSIS — L84 Corns and callosities: Secondary | ICD-10-CM

## 2024-01-04 NOTE — Progress Notes (Signed)
 Randall presents today for follow-up of her preulcerative lesion second digit distally left foot.  She was seen by her PCP who told her she should come and see us .  She continues to wear her crest pad every day.  Objective: Vital signs stable alert oriented x 3 pulses are palpable.  She has semirigid hammertoe deformity second left this resulted in preulcerative lesion with considerable hyperkeratosis and subungual hematoma as well as a subcutaneous hematoma.  All of the skin was sharply resected today noting no bleeding.  Assessment: Hammertoe deformity diabetes preulcerative lesion.  Plan: Debrided the area today continue to utilize the buttress pads.

## 2024-02-20 ENCOUNTER — Encounter: Payer: Self-pay | Admitting: Podiatry

## 2024-02-20 ENCOUNTER — Ambulatory Visit: Admitting: Podiatry

## 2024-02-20 DIAGNOSIS — B351 Tinea unguium: Secondary | ICD-10-CM

## 2024-02-20 DIAGNOSIS — E1142 Type 2 diabetes mellitus with diabetic polyneuropathy: Secondary | ICD-10-CM | POA: Diagnosis not present

## 2024-02-20 DIAGNOSIS — M79609 Pain in unspecified limb: Secondary | ICD-10-CM

## 2024-02-20 DIAGNOSIS — L84 Corns and callosities: Secondary | ICD-10-CM

## 2024-02-20 NOTE — Progress Notes (Signed)
 She presents today for follow-up of her diabetes diabetic peripheral neuropathy.  Objective: Vital signs are stable alert and oriented x 3.  Pulses are palpable.  Toenails are long thick yellow dystrophic with mycotic mallet toe deformity with mild reactive hyperkeratosis which was debrided today no open lesions or wounds are noted.  Assessment: Pain in limb secondary to diabetic peripheral neuropathy and painful elongated toenails.  Plan: Debridement of toenails 1 through 5 bilateral.

## 2024-05-28 ENCOUNTER — Ambulatory Visit: Admitting: Podiatry
